# Patient Record
Sex: Female | Born: 1980 | Race: Black or African American | Hispanic: No | Marital: Married | Smoking: Never smoker
Health system: Southern US, Community
[De-identification: ages and names within clinical notes are randomized; demographics above are authoritative.]

## PROBLEM LIST (undated history)

## (undated) ENCOUNTER — Inpatient Hospital Stay (HOSPITAL_COMMUNITY): Payer: Self-pay

## (undated) DIAGNOSIS — F419 Anxiety disorder, unspecified: Secondary | ICD-10-CM

## (undated) DIAGNOSIS — I10 Essential (primary) hypertension: Secondary | ICD-10-CM

## (undated) DIAGNOSIS — N390 Urinary tract infection, site not specified: Secondary | ICD-10-CM

## (undated) DIAGNOSIS — R51 Headache: Secondary | ICD-10-CM

---
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
## 2001-04-28 ENCOUNTER — Emergency Department (HOSPITAL_COMMUNITY): Admission: EM | Admit: 2001-04-28 | Discharge: 2001-04-28 | Payer: Self-pay | Admitting: Emergency Medicine

## 2003-03-03 ENCOUNTER — Emergency Department (HOSPITAL_COMMUNITY): Admission: AD | Admit: 2003-03-03 | Discharge: 2003-03-03 | Payer: Self-pay | Admitting: Internal Medicine

## 2003-03-05 ENCOUNTER — Ambulatory Visit (HOSPITAL_COMMUNITY): Admission: RE | Admit: 2003-03-05 | Discharge: 2003-03-05 | Payer: Self-pay | Admitting: Family Medicine

## 2003-03-05 ENCOUNTER — Emergency Department (HOSPITAL_COMMUNITY): Admission: EM | Admit: 2003-03-05 | Discharge: 2003-03-05 | Payer: Self-pay | Admitting: Family Medicine

## 2003-03-06 ENCOUNTER — Emergency Department (HOSPITAL_COMMUNITY): Admission: EM | Admit: 2003-03-06 | Discharge: 2003-03-06 | Payer: Self-pay | Admitting: Family Medicine

## 2003-04-21 ENCOUNTER — Inpatient Hospital Stay (HOSPITAL_COMMUNITY): Admission: AD | Admit: 2003-04-21 | Discharge: 2003-04-21 | Payer: Self-pay | Admitting: Obstetrics and Gynecology

## 2003-04-24 ENCOUNTER — Emergency Department (HOSPITAL_COMMUNITY): Admission: EM | Admit: 2003-04-24 | Discharge: 2003-04-24 | Payer: Self-pay | Admitting: Emergency Medicine

## 2005-10-27 ENCOUNTER — Emergency Department (HOSPITAL_COMMUNITY): Admission: EM | Admit: 2005-10-27 | Discharge: 2005-10-28 | Payer: Self-pay | Admitting: Emergency Medicine

## 2005-11-09 ENCOUNTER — Emergency Department (HOSPITAL_COMMUNITY): Admission: EM | Admit: 2005-11-09 | Discharge: 2005-11-09 | Payer: Self-pay | Admitting: Emergency Medicine

## 2005-11-23 ENCOUNTER — Emergency Department (HOSPITAL_COMMUNITY): Admission: EM | Admit: 2005-11-23 | Discharge: 2005-11-23 | Payer: Self-pay | Admitting: Emergency Medicine

## 2006-05-14 ENCOUNTER — Emergency Department (HOSPITAL_COMMUNITY): Admission: EM | Admit: 2006-05-14 | Discharge: 2006-05-14 | Payer: Self-pay | Admitting: Emergency Medicine

## 2006-05-16 ENCOUNTER — Emergency Department (HOSPITAL_COMMUNITY): Admission: EM | Admit: 2006-05-16 | Discharge: 2006-05-16 | Payer: Self-pay | Admitting: Family Medicine

## 2006-06-07 ENCOUNTER — Emergency Department (HOSPITAL_COMMUNITY): Admission: EM | Admit: 2006-06-07 | Discharge: 2006-06-07 | Payer: Self-pay | Admitting: Emergency Medicine

## 2007-06-10 ENCOUNTER — Inpatient Hospital Stay (HOSPITAL_COMMUNITY): Admission: AD | Admit: 2007-06-10 | Discharge: 2007-06-10 | Payer: Self-pay | Admitting: Obstetrics and Gynecology

## 2007-06-19 ENCOUNTER — Inpatient Hospital Stay (HOSPITAL_COMMUNITY): Admission: RE | Admit: 2007-06-19 | Discharge: 2007-06-19 | Payer: Self-pay | Admitting: Obstetrics and Gynecology

## 2007-07-13 ENCOUNTER — Inpatient Hospital Stay (HOSPITAL_COMMUNITY): Admission: AD | Admit: 2007-07-13 | Discharge: 2007-07-13 | Payer: Self-pay | Admitting: Obstetrics and Gynecology

## 2007-09-07 ENCOUNTER — Inpatient Hospital Stay (HOSPITAL_COMMUNITY): Admission: AD | Admit: 2007-09-07 | Discharge: 2007-09-07 | Payer: Self-pay | Admitting: Obstetrics and Gynecology

## 2007-10-01 ENCOUNTER — Inpatient Hospital Stay (HOSPITAL_COMMUNITY): Admission: AD | Admit: 2007-10-01 | Discharge: 2007-10-01 | Payer: Self-pay | Admitting: Obstetrics and Gynecology

## 2007-11-28 ENCOUNTER — Encounter: Admission: RE | Admit: 2007-11-28 | Discharge: 2007-11-28 | Payer: Self-pay | Admitting: Obstetrics and Gynecology

## 2008-01-07 ENCOUNTER — Inpatient Hospital Stay (HOSPITAL_COMMUNITY): Admission: AD | Admit: 2008-01-07 | Discharge: 2008-01-07 | Payer: Self-pay | Admitting: Obstetrics and Gynecology

## 2008-02-03 ENCOUNTER — Inpatient Hospital Stay (HOSPITAL_COMMUNITY): Admission: AD | Admit: 2008-02-03 | Discharge: 2008-02-04 | Payer: Self-pay | Admitting: Obstetrics and Gynecology

## 2008-02-04 ENCOUNTER — Inpatient Hospital Stay (HOSPITAL_COMMUNITY): Admission: AD | Admit: 2008-02-04 | Discharge: 2008-02-07 | Payer: Self-pay | Admitting: Obstetrics and Gynecology

## 2008-04-19 ENCOUNTER — Emergency Department (HOSPITAL_COMMUNITY): Admission: EM | Admit: 2008-04-19 | Discharge: 2008-04-19 | Payer: Self-pay | Admitting: Emergency Medicine

## 2008-06-06 ENCOUNTER — Emergency Department (HOSPITAL_COMMUNITY): Admission: EM | Admit: 2008-06-06 | Discharge: 2008-06-06 | Payer: Self-pay | Admitting: Emergency Medicine

## 2008-09-08 ENCOUNTER — Emergency Department (HOSPITAL_COMMUNITY): Admission: EM | Admit: 2008-09-08 | Discharge: 2008-09-08 | Payer: Self-pay | Admitting: Family Medicine

## 2010-01-04 ENCOUNTER — Emergency Department (HOSPITAL_COMMUNITY)
Admission: EM | Admit: 2010-01-04 | Discharge: 2010-01-04 | Payer: Self-pay | Source: Home / Self Care | Admitting: Emergency Medicine

## 2010-02-21 ENCOUNTER — Encounter: Payer: Self-pay | Admitting: Family Medicine

## 2010-04-13 LAB — POCT RAPID STREP A (OFFICE): Streptococcus, Group A Screen (Direct): NEGATIVE

## 2010-05-12 LAB — URINE CULTURE: Colony Count: >=100000

## 2010-05-12 LAB — URINALYSIS, ROUTINE W REFLEX MICROSCOPIC
Ketones, ur: NEGATIVE mg/dL
Nitrite: NEGATIVE
Protein, ur: 100 mg/dL — AB

## 2010-05-12 LAB — PREGNANCY, URINE: Preg Test, Ur: NEGATIVE

## 2010-05-12 LAB — URINE MICROSCOPIC-ADD ON

## 2010-05-18 LAB — CBC
HCT: 35.3 % — ABNORMAL LOW (ref 36.0–46.0)
HCT: 40.5 % (ref 36.0–46.0)
HCT: 41.8 % (ref 36.0–46.0)
Hemoglobin: 13.6 g/dL (ref 12.0–15.0)
MCHC: 33.4 g/dL (ref 30.0–36.0)
MCV: 93.8 fL (ref 78.0–100.0)
Platelets: 199 10*3/uL (ref 150–400)
Platelets: 209 10*3/uL (ref 150–400)
Platelets: 234 10*3/uL (ref 150–400)
RBC: 4.32 MIL/uL (ref 3.87–5.11)
RDW: 15.2 % (ref 11.5–15.5)
RDW: 15.4 % (ref 11.5–15.5)
WBC: 14.4 10*3/uL — ABNORMAL HIGH (ref 4.0–10.5)
WBC: 15.1 10*3/uL — ABNORMAL HIGH (ref 4.0–10.5)

## 2010-05-18 LAB — CCBB MATERNAL DONOR DRAW

## 2010-05-18 LAB — URINALYSIS, DIPSTICK ONLY
Nitrite: NEGATIVE
Protein, ur: NEGATIVE mg/dL
Specific Gravity, Urine: <1.005 — ABNORMAL LOW (ref 1.005–1.030)
Urobilinogen, UA: 0.2 mg/dL (ref 0.0–1.0)

## 2010-05-18 LAB — GLUCOSE, CAPILLARY

## 2010-05-18 LAB — COMPREHENSIVE METABOLIC PANEL
Alkaline Phosphatase: 62 U/L (ref 39–117)
BUN: 5 mg/dL — ABNORMAL LOW (ref 6–23)
CO2: 24 mEq/L (ref 19–32)
Chloride: 98 mEq/L (ref 96–112)
Creatinine, Ser: 0.73 mg/dL (ref 0.4–1.2)
GFR calc non Af Amer: >60 mL/min (ref >60)
Glucose, Bld: 112 mg/dL — ABNORMAL HIGH (ref 70–99)
Potassium: 4.6 mEq/L (ref 3.5–5.1)
Total Bilirubin: 0.6 mg/dL (ref 0.3–1.2)

## 2010-05-18 LAB — URIC ACID: Uric Acid, Serum: 5.8 mg/dL (ref 2.4–7.0)

## 2010-05-18 LAB — LACTATE DEHYDROGENASE: LDH: 131 U/L (ref 94–250)

## 2010-06-16 NOTE — Op Note (Signed)
NAMEDoreen Hays, [name]               ACCOUNT NO.:  [id number]   MEDICAL RECORD NO.:  [id number]          PATIENT TYPE:  INP   LOCATION:  9146                          FACILITY:  [facility]   PHYSICIAN:  Huel Cote, M.D. DATE OF BIRTH:  1980-07-14   DATE OF PROCEDURE:  02/04/2008  DATE OF DISCHARGE:                               OPERATIVE REPORT   PREOPERATIVE DIAGNOSES:  1. Term pregnancy at 39+ weeks.  2. Gestational diabetes mellitus.  3. Arrest of dilatation and descent relative.   POSTOPERATIVE DIAGNOSES:  1. Term pregnancy at 39+ weeks.  2. Gestational diabetes mellitus.  3. Arrest of dilatation and descent relative.   PROCEDURE:  Primary low transverse cesarean section with double layer  closure of uterus.   SURGEON:  Huel Cote, MD   ANESTHESIA:  Epidural.   FINDINGS:  A vigorous female infant in the vertex presentation.  Apgars  were 9 and 9.  Weight was 7 pounds 4 ounces.  She had normal uterus,  tubes, and ovaries noted bilaterally.   SPECIMEN:  Placenta which was sent to L&D.   ESTIMATED BLOOD LOSS:  650 mL.   URINE OUTPUT:  100 mL urine, which cleared as the case progressed.   INTRAVENOUS FLUIDS:  1900 mL LR.   COMPLICATIONS:  None known.   PROCEDURE:  After appropriate informed consent was obtained.  When the  patient declined to continue to labor and refused to push, she was taken  to the operating room and epidural anesthesia found to be adequate by  Allis clamp test.  She was then prepped and draped in the normal sterile  fashion in the dorsal supine position with a leftward tilt.  A  Pfannenstiel skin incision was then made with a scalpel and carried  through to the underlying layer of fascia by sharp dissection and Bovie  cautery.  Fascia was nicked in the midline and the incision extended  laterally with Mayo scissors.  The inferior aspect of the incision was  grasped with Kocher clamps, elevated, and dissected off the underlying  rectus  muscles.  In the similar fashion, these were dissected off the  rectus muscles superiorly.  The rectus muscles were separated in the  midline and the peritoneal cavity entered bluntly.  The peritoneal  incision was then extended both superiorly and inferiorly with careful  attention to avoid both bowel and bladder.  The Alexis self-retaining  wound retractor was then placed within the incision and the lower  uterine segment exposed well.  The bladder flap was then created and  pushed away from the lower uterine segment, which was then incised in a  transverse fashion.  The cavity itself was entered bluntly.  The  infant's head was then delivered atraumatically and nose and mouth bulb  suctioned.  The remainder of the infant delivered easily.  Cord clamped  and cut and handed to awaiting pediatricians.  The placenta was  expressed spontaneously and handed off for cord blood donation.  The  uterus was cleared of all clots and debris with moist lap sponge and the  uterine incision was then closed  in 2 layers, the first a running locked  layer of 0 chromic and the second an imbricating layer of the same  suture.  Once this was closed, a good hemostasis was noted and the  ovaries and tubes were inspected bilaterally.  The abdomen and pelvis  were irrigated and the gutters cleared of all clots and debris with  moist lap sponge.  The incision was once again inspected and no active  bleeding noted.  Several small areas of oozing were controlled with  Bovie cautery, and at this point, all instruments and sponges were  removed from the abdomen.  The Alexis retractor was removed and the  rectus muscles reapproximated with several interrupted mattress sutures  of 0 Vicryl.  The fascia was then closed with 0 Vicryl in a running  fashion and the subcutaneous tissue reapproximated with 3-0 plain in a  running fashion and the skin closed with staples.  Sponge, lap, and  needle counts were correct x2, and  the patient was taken in good  condition to the recovery room.      Huel Cote, M.D.  Electronically Signed     KR/MEDQ  D:  02/04/2008  T:  02/05/2008  Job:  [id number]

## 2010-06-19 NOTE — Discharge Summary (Signed)
NAMEDoreen Hays, Caylie               ACCOUNT NO.:  [id number]   MEDICAL RECORD NO.:  [id number]          PATIENT TYPE:  INP   LOCATION:  9146                          FACILITY:  [facility]   PHYSICIAN:  Huel Cote, M.D. DATE OF BIRTH:  06-15-1980   DATE OF ADMISSION:  02/04/2008  DATE OF DISCHARGE:  02/07/2008                               DISCHARGE SUMMARY   DISCHARGE DIAGNOSES:  1. Term pregnancy at 39 weeks delivered.  2. Arrest of dilation and descent.  3. Status post primary low transverse cesarean section with double-      layer closure of uterus.   DISCHARGE MEDICATIONS:  1. Motrin 600 every 6 hours p.r.n.  2. Percocet 1-2 tablets p.o. every 4 hours p.r.n.   DISCHARGE FOLLOWUP:  The patient is to follow up in the office in 2  weeks for an incision check.   HOSPITAL COURSE:  The patient is a 30 year old G1, P0 who was admitted  at 34 plus weeks' gestation with contractions every 5 minutes.  Cervix  changed from 80 and 2 cm to 80 and 3 cm.   OBSERVATION:  Prenatal care was complicated by slightly shortened cervix  on ultrasound with a positive fetal fibronectin at 25 weeks.  Also, she  had gestational diabetes which was diet controlled overall adequately  with occasional high postprandial blood sugars.  Size was greater than  dates on her last ultrasound at 35 weeks.  The patient did have an  average-size baby documented, however.   PRENATAL LABS:  Are as follows; O positive, antibody negative, sickle  negative, RPR nonreactive, rubella immune, hepatitis B surface antigen  negative, HIV negative, GC negative, Chlamydia negative, group B strep  negative, 1-hour Glucola 144, and 3-hour Glucola was not tolerated, so  the patient did finger sticks.  First trimester screen normal.   PAST OBSTETRICAL HISTORY:  None.   PAST GYN HISTORY:  No abnormal Pap smears.   PAST MEDICAL HISTORY:  None.   PAST SURGICAL HISTORY:  Breast reduction in 2000.   ALLERGIES:  None.   On  admission, the patient was afebrile with stable vital signs.  Fetal  heart rate was reactive.  Blood pressure was 148/80s.  She, as stated,  progressed to 3-4 cm, was admitted and received an epidural.  After the  epidural, the patient did have a fetal heart rate deceleration to the  90s that responded to position change and ephedrine.  She had internal  monitors placed to assist with adjusting any Pitocin augmentation.  She  also had another impressive deceleration to the 50s to 60s, which took  approximately 67 minutes to fully recover.  She did have repositioning,  oxygen applied and scalp stimulation, and was given more ephedrine at  this point and the baby did recover, though slowly.  At this point, she  was complete 4-5 cm totally effaced and 4-5 cm in a -1 station.  We  watched the fetal heart rate closely, which did improve back to  reassuring and she was placed on Pitocin augmentation.  She progressed  to 8-9 cm with OP presentation and  began having some rectal pressure  which was bothersome to her.  We continued to follow her progress and  after approximately another 2 hours, she was complete and rim at a 0  station with an LOP presentation.  I discussed with the patient that the  rim was reducible and that we could try to get her more comfortable and  continue to passively labor to see if the head would get any lower or  she could begin pushing.  The patient declined both options and stated  that she felt too weak to push ever and therefore we placed her in  exaggerated films and let her continue to labor for an additional hour.  At this point, the patient remained essentially the same.  Cervix was  complete with a reducible rim OP in a 0 station.  We again offered the  option of reducing the epidural and trying to continue pushing.  The  patient flatly declined pushing longer stating she was too weak and too  tired and wished to proceed with a C-section without any further   attempts at vaginal delivery.  We discussed the risks and benefits of  this in detail and she stated she wished to proceed.  She, therefore,  underwent a primary low transverse C-section with a double-layer closure  of uterus.  She was delivered of a vigorous female infant, Apgars were 9  and 9, weight was 7 pounds 4 ounces.  She was admitted for routine  postoperative care and did well.  On postop day #1, her hemoglobin was  11.9, which was appropriate drop from her cesarean section.  By postop  day #3, she was doing quite well, tolerating regular diet, her incision  was clear, her Staples removed and Steri-Strips placed, and she was  discharged to home with followup as stated in the office in 2 weeks for  an incision check.      Huel Cote, M.D.  Electronically Signed     KR/MEDQ  D:  03/02/2008  T:  03/02/2008  Job:  [id number]

## 2010-11-28 ENCOUNTER — Inpatient Hospital Stay (HOSPITAL_COMMUNITY): Payer: Medicaid Other

## 2010-11-28 ENCOUNTER — Inpatient Hospital Stay (HOSPITAL_COMMUNITY)
Admission: AD | Admit: 2010-11-28 | Discharge: 2010-11-28 | Disposition: A | Discharge status: Home / Self Care | Payer: Medicaid Other | Source: Ambulatory Visit | Attending: Obstetrics & Gynecology | Admitting: Obstetrics & Gynecology

## 2010-11-28 ENCOUNTER — Encounter (HOSPITAL_COMMUNITY): Payer: Self-pay | Admitting: *Deleted

## 2010-11-28 DIAGNOSIS — O99891 Other specified diseases and conditions complicating pregnancy: Secondary | ICD-10-CM | POA: Insufficient documentation

## 2010-11-28 DIAGNOSIS — K5289 Other specified noninfective gastroenteritis and colitis: Secondary | ICD-10-CM

## 2010-11-28 DIAGNOSIS — Z369 Encounter for antenatal screening, unspecified: Secondary | ICD-10-CM

## 2010-11-28 DIAGNOSIS — R109 Unspecified abdominal pain: Secondary | ICD-10-CM | POA: Insufficient documentation

## 2010-11-28 DIAGNOSIS — K529 Noninfective gastroenteritis and colitis, unspecified: Secondary | ICD-10-CM

## 2010-11-28 DIAGNOSIS — Z36 Encounter for antenatal screening of mother: Secondary | ICD-10-CM

## 2010-11-28 HISTORY — DX: Urinary tract infection, site not specified: N39.0

## 2010-11-28 LAB — URINE MICROSCOPIC-ADD ON

## 2010-11-28 LAB — POCT PREGNANCY, URINE: Preg Test, Ur: POSITIVE

## 2010-11-28 LAB — URINALYSIS, ROUTINE W REFLEX MICROSCOPIC
Glucose, UA: NEGATIVE mg/dL
Specific Gravity, Urine: 1.02 (ref 1.005–1.030)
pH: 7 (ref 5.0–8.0)

## 2010-11-28 LAB — CBC
HCT: 39.5 % (ref 36.0–46.0)
MCH: 29.7 pg (ref 26.0–34.0)
MCV: 87.6 fL (ref 78.0–100.0)
Platelets: 279 10*3/uL (ref 150–400)
RBC: 4.51 MIL/uL (ref 3.87–5.11)
RDW: 14.3 % (ref 11.5–15.5)
WBC: 12.5 10*3/uL — ABNORMAL HIGH (ref 4.0–10.5)

## 2010-11-28 LAB — DIFFERENTIAL
Eosinophils Absolute: 0.1 10*3/uL (ref 0.0–0.7)
Eosinophils Relative: 1 % (ref 0–5)
Lymphocytes Relative: 29 % (ref 12–46)
Lymphs Abs: 3.6 10*3/uL (ref 0.7–4.0)
Monocytes Absolute: 1.2 10*3/uL — ABNORMAL HIGH (ref 0.1–1.0)

## 2010-11-28 LAB — WET PREP, GENITAL
Trich, Wet Prep: NONE SEEN
Yeast Wet Prep HPF POC: NONE SEEN

## 2010-11-28 MED ORDER — PROMETHAZINE HCL 25 MG PO TABS: 12.5000 mg | ORAL_TABLET | Freq: Four times a day (QID) | ORAL | Status: AC | PRN

## 2010-11-28 NOTE — Progress Notes (Signed)
HPT +, Pt reports having LLQ pain on and off since yesterday. Pain increased after her daughter ran into her about an hour ago. Denies and vag bleeding or discharge.

## 2010-11-28 NOTE — ED Provider Notes (Signed)
History     CSN: [id number] Arrival date & time: 11/28/2010  5:49 PM   None     Chief Complaint  Patient presents with  . Abdominal Pain   HPI Jamie Hays is a 29 y.o. obese AA female who presents to [facility] for lower abdominal pain and a positive home pregnancy test. She states the pain started a few days ago and is associated with nausea, vomiting and diarrhea. The history was provided by the patient. Past Medical History  Diagnosis Date  . Urinary tract infection     Past Surgical History  Procedure Date  . Cesarean section   . Breast reduction surgery 2000    No family history on file.  History  Substance Use Topics  . Smoking status: Never Smoker   . Smokeless tobacco: Never Used  . Alcohol Use: 0.6 oz/week    1 Shots of liquor per week    OB History    Grav Para Term Preterm Abortions TAB SAB Ect Mult Living   1               Review of Systems  Constitutional: Positive for chills and fatigue. Negative for appetite change.  HENT: Negative.   Respiratory: Negative.   Gastrointestinal: Positive for nausea, vomiting, abdominal pain and diarrhea.  Genitourinary: Positive for frequency, vaginal discharge and pelvic pain. Negative for dysuria, vaginal bleeding and difficulty urinating.  Skin: Negative.   Neurological: Negative for dizziness.  Psychiatric/Behavioral: Negative for agitation. The patient is not nervous/anxious.     Allergies  Review of patient's allergies indicates no known allergies.  Home Medications  No current outpatient prescriptions on file.  BP 130/97  Pulse 92  Temp(Src) 99.9 F (37.7 C) (Oral)  Resp 18  Ht 5\' 2"  (1.575 m)  Wt 225 lb (102.059 kg)  BMI 41.15 kg/m2  LMP 10/01/2010  Physical Exam  Nursing note and vitals reviewed. Constitutional: She is oriented to person, place, and time. No distress.       Obese   HENT:  Head: Normocephalic.  Eyes: EOM are normal.  Neck: Neck supple.  Cardiovascular: Normal rate.     Pulmonary/Chest: Effort normal.  Abdominal: Soft. There is no tenderness.  Genitourinary:       External genitalia without lesions. White vaginal discharge. Cervix long and closed, no CMT, mild left adnexal tenderness. Uterus without palpable enlargement.  Musculoskeletal: Normal range of motion.  Neurological: She is alert and oriented to person, place, and time. No cranial nerve deficit.  Skin: Skin is warm and dry.  Psychiatric: She has a normal mood and affect. Her behavior is normal. Judgment and thought content normal.   Results for orders placed during the hospital encounter of 11/28/10 (from the past 24 hour(s))  URINALYSIS, ROUTINE W REFLEX MICROSCOPIC     Status: Abnormal   Collection Time   11/28/10  6:00 PM      Component Value Range   Color, Urine YELLOW  YELLOW    Appearance CLEAR  CLEAR    Specific Gravity, Urine 1.020  1.005 - 1.030    pH 7.0  5.0 - 8.0    Glucose, UA NEGATIVE  NEGATIVE (mg/dL)   Hgb urine dipstick TRACE (*) NEGATIVE    Bilirubin Urine NEGATIVE  NEGATIVE    Ketones, ur NEGATIVE  NEGATIVE (mg/dL)   Protein, ur 30 (*) NEGATIVE (mg/dL)   Urobilinogen, UA 0.2  0.0 - 1.0 (mg/dL)   Nitrite NEGATIVE  NEGATIVE    Leukocytes, UA  NEGATIVE  NEGATIVE   URINE MICROSCOPIC-ADD ON     Status: Abnormal   Collection Time   11/28/10  6:00 PM      Component Value Range   Squamous Epithelial / LPF FEW (*) RARE    WBC, UA 0-2  <3 (WBC/hpf)   RBC / HPF 0-2  <3 (RBC/hpf)   Bacteria, UA MANY (*) RARE   POCT PREGNANCY, URINE     Status: Normal   Collection Time   11/28/10  6:07 PM      Component Value Range   Preg Test, Ur POSITIVE    WET PREP, GENITAL     Status: Abnormal   Collection Time   11/28/10  6:52 PM      Component Value Range   Yeast, Wet Prep NONE SEEN  NONE SEEN    Trich, Wet Prep NONE SEEN  NONE SEEN    Clue Cells, Wet Prep FEW (*) NONE SEEN    WBC, Wet Prep HPF POC FEW (*) NONE SEEN   ABO/RH     Status: Normal   Collection Time   11/28/10  6:52  PM      Component Value Range   ABO/RH(D) O POS    CBC     Status: Abnormal   Collection Time   11/28/10  6:53 PM      Component Value Range   WBC 12.5 (*) 4.0 - 10.5 (K/uL)   RBC 4.51  3.87 - 5.11 (MIL/uL)   Hemoglobin 13.4  12.0 - 15.0 (g/dL)   HCT 45.4  09.8 - 11.9 (%)   MCV 87.6  78.0 - 100.0 (fL)   MCH 29.7  26.0 - 34.0 (pg)   MCHC 33.9  30.0 - 36.0 (g/dL)   RDW 14.7  82.9 - 56.2 (%)   Platelets 279  150 - 400 (K/uL)  DIFFERENTIAL     Status: Abnormal   Collection Time   11/28/10  6:53 PM      Component Value Range   Neutrophils Relative 61  43 - 77 (%)   Neutro Abs 7.6  1.7 - 7.7 (K/uL)   Lymphocytes Relative 29  12 - 46 (%)   Lymphs Abs 3.6  0.7 - 4.0 (K/uL)   Monocytes Relative 10  3 - 12 (%)   Monocytes Absolute 1.2 (*) 0.1 - 1.0 (K/uL)   Eosinophils Relative 1  0 - 5 (%)   Eosinophils Absolute 0.1  0.0 - 0.7 (K/uL)   Basophils Relative 0  0 - 1 (%)   Basophils Absolute 0.0  0.0 - 0.1 (K/uL)  HCG, QUANTITATIVE, PREGNANCY     Status: Abnormal   Collection Time   11/28/10  6:53 PM      Component Value Range   hCG, Beta Chain, Quant, S 24322 (*) <5 (mIU/mL)   US Ob Comp Less 14 Wks  11/28/2010  *RADIOLOGY REPORT*  Clinical Data: 30 year old female with left lower quadrant pain.  8 weeks 2 days pregnant by LMP.  OBSTETRIC <14 WK ULTRASOUND  Technique:  Transabdominal ultrasound was performed for evaluation of the gestation as well as the maternal uterus and adnexal regions.  Comparison:  None relative.  Intrauterine gestational sac: Single Yolk sac: Visible Embryo: Visible Cardiac Activity: Detected Heart Rate: 113 bpm  CRL:  6.4 mm  6w  4d          [place] EDC: 07/20/2011  Maternal uterus/Adnexae: No subchorionic hemorrhage.  No pelvic free fluid.  The right ovary is normal measuring  2.4 x 1.3 x 1.5 cm.  The left ovary is normal measuring 3.3 x 1.8 x 2.1 cm.  IMPRESSION: Viable singleton intrauterine pregnancy with estimated gestational age of [redacted] weeks and 4 days by crown-rump  length.  Original Report Authenticated By: Harley Hallmark, M.D.   Assessment: First trimester pregnancy/viable IUP   Gastroenteritis  Plan:  B.R.A.T diet   Phenergan    Start prenatal care   Return here as needed.  ED Course  Procedures  MDM          Kerrie Buffalo, NP 11/28/10 [phone]

## 2010-12-01 LAB — GC/CHLAMYDIA PROBE AMP, GENITAL: GC Probe Amp, Genital: NEGATIVE

## 2010-12-16 ENCOUNTER — Encounter (HOSPITAL_COMMUNITY): Payer: Self-pay | Admitting: *Deleted

## 2010-12-16 ENCOUNTER — Inpatient Hospital Stay (HOSPITAL_COMMUNITY)
Admission: AD | Admit: 2010-12-16 | Discharge: 2010-12-16 | Disposition: A | Discharge status: Home / Self Care | Payer: Medicaid Other | Source: Ambulatory Visit | Attending: Obstetrics and Gynecology | Admitting: Obstetrics and Gynecology

## 2010-12-16 DIAGNOSIS — B9689 Other specified bacterial agents as the cause of diseases classified elsewhere: Secondary | ICD-10-CM | POA: Insufficient documentation

## 2010-12-16 DIAGNOSIS — A499 Bacterial infection, unspecified: Secondary | ICD-10-CM

## 2010-12-16 DIAGNOSIS — O2390 Unspecified genitourinary tract infection in pregnancy, unspecified trimester: Secondary | ICD-10-CM | POA: Insufficient documentation

## 2010-12-16 DIAGNOSIS — R109 Unspecified abdominal pain: Secondary | ICD-10-CM | POA: Insufficient documentation

## 2010-12-16 DIAGNOSIS — N760 Acute vaginitis: Secondary | ICD-10-CM | POA: Insufficient documentation

## 2010-12-16 LAB — URINALYSIS, ROUTINE W REFLEX MICROSCOPIC
Bilirubin Urine: NEGATIVE
Ketones, ur: NEGATIVE mg/dL
Nitrite: NEGATIVE
Protein, ur: NEGATIVE mg/dL
Urobilinogen, UA: 0.2 mg/dL (ref 0.0–1.0)
pH: 6.5 (ref 5.0–8.0)

## 2010-12-16 LAB — URINE MICROSCOPIC-ADD ON

## 2010-12-16 LAB — WET PREP, GENITAL: Trich, Wet Prep: NONE SEEN

## 2010-12-16 MED ORDER — CONCEPT OB 130-92.4-1 MG PO CAPS: 1.0000 | ORAL_CAPSULE | Freq: Every day | ORAL | Status: DC

## 2010-12-16 MED ORDER — METRONIDAZOLE 500 MG PO TABS: 500.0000 mg | ORAL_TABLET | Freq: Two times a day (BID) | ORAL | Status: AC

## 2010-12-16 NOTE — ED Notes (Signed)
Levi Aland CNM at bedside with ultrasound to verify fetal heart rate. Heart rate seen on ultrasound.

## 2010-12-16 NOTE — Progress Notes (Signed)
Pt reports she has been having cramps since she found out she was pregnant but worsened last pm. Denies vaginal bleeding or discharge. Denies dysuria.

## 2010-12-16 NOTE — ED Provider Notes (Signed)
History   Jamie Hays is a 30 y.o. year old G29P1001 female at [redacted]w[redacted]d weeks gestation who presents to [facility] reporting cramping throughout the pregnancy that worsened last night. She denies vaginal bleeding, vaginal discharge or UTI Sx.  CSN: [id number] Arrival date & time: 12/16/2010  9:05 PM   None     Chief Complaint  Patient presents with  . Abdominal Pain    (Consider location/radiation/quality/duration/timing/severity/associated sxs/prior treatment) HPI  Past Medical History  Diagnosis Date  . Urinary tract infection     Past Surgical History  Procedure Date  . Cesarean section   . Breast reduction surgery 2000    History reviewed. No pertinent family history.  History  Substance Use Topics  . Smoking status: Never Smoker   . Smokeless tobacco: Never Used  . Alcohol Use: 0.6 oz/week    1 Shots of liquor per week    OB History    Grav Para Term Preterm Abortions TAB SAB Ect Mult Living   2 1 1       1       Review of Systems; Otherwise neg  Allergies  Review of patient's allergies indicates no known allergies.  Home Medications  No current outpatient prescriptions on file.  BP 139/91  Pulse 92  Temp 98 F (36.7 C)  Resp 18  Ht 5\' 2"  (1.575 m)  Wt 106.142 kg (234 lb)  BMI 42.80 kg/m2  SpO2 97%  LMP 10/01/2010  Physical Exam  Nursing note and vitals reviewed. Constitutional: She is oriented to person, place, and time. She appears well-developed and well-nourished. No distress.  Cardiovascular: Normal rate.   Pulmonary/Chest: Effort normal.  Abdominal: Soft. There is no tenderness.  Genitourinary: Vagina normal. No bleeding around the vagina. No vaginal discharge found.  Neurological: She is alert and oriented to person, place, and time.  Skin: Skin is warm and dry.  Psychiatric: She has a normal mood and affect.     Cervix long and closed FHR 160's by informal BS [place]. FM seen.  Results for orders placed during the hospital encounter of  12/16/10 (from the past 24 hour(s))  URINALYSIS, ROUTINE W REFLEX MICROSCOPIC     Status: Abnormal   Collection Time   12/16/10  9:45 PM      Component Value Range   Color, Urine YELLOW  YELLOW    Appearance CLEAR  CLEAR    Specific Gravity, Urine >1.030 (*) 1.005 - 1.030    pH 6.5  5.0 - 8.0    Glucose, UA NEGATIVE  NEGATIVE (mg/dL)   Hgb urine dipstick SMALL (*) NEGATIVE    Bilirubin Urine NEGATIVE  NEGATIVE    Ketones, ur NEGATIVE  NEGATIVE (mg/dL)   Protein, ur NEGATIVE  NEGATIVE (mg/dL)   Urobilinogen, UA 0.2  0.0 - 1.0 (mg/dL)   Nitrite NEGATIVE  NEGATIVE    Leukocytes, UA NEGATIVE  NEGATIVE   URINE MICROSCOPIC-ADD ON     Status: Normal   Collection Time   12/16/10  9:45 PM      Component Value Range   Squamous Epithelial / LPF RARE  RARE    RBC / HPF 0-2  <3 (RBC/hpf)   Bacteria, UA RARE  RARE    Urine-Other MUCOUS PRESENT    WET PREP, GENITAL     Status: Abnormal   Collection Time   12/16/10 10:01 PM      Component Value Range   Yeast, Wet Prep NONE SEEN  NONE SEEN    Trich, Wet  Prep NONE SEEN  NONE SEEN    Clue Cells, Wet Prep FEW (*) NONE SEEN    WBC, Wet Prep HPF POC RARE (*) NONE SEEN     ED Course  Procedures (including critical care time)   MDM  Assessment: 1. 9.1 weeks IUP 2. BV  Plan: 1. Rx Flagyl and PNV 2. Start PNC 3. Urine Culture  Dorathy Kinsman 12/16/2010 10:53 PM

## 2010-12-18 LAB — URINE CULTURE: Special Requests: NORMAL

## 2010-12-19 NOTE — ED Provider Notes (Signed)
Attestation of Attending Supervision of Advanced Practitioner: Evaluation and management procedures were performed by the PA/NP/CNM/OB Fellow under my supervision/collaboration. Chart reviewed and agree with management and plan.  Kismet Facemire V 12/19/2010 7:52 AM

## 2011-01-12 ENCOUNTER — Encounter (HOSPITAL_COMMUNITY): Payer: Self-pay | Admitting: *Deleted

## 2011-01-12 ENCOUNTER — Inpatient Hospital Stay (HOSPITAL_COMMUNITY)
Admission: AD | Admit: 2011-01-12 | Discharge: 2011-01-12 | Disposition: A | Discharge status: Home / Self Care | Payer: Medicaid Other | Source: Ambulatory Visit | Attending: Obstetrics & Gynecology | Admitting: Obstetrics & Gynecology

## 2011-01-12 DIAGNOSIS — O210 Mild hyperemesis gravidarum: Secondary | ICD-10-CM | POA: Insufficient documentation

## 2011-01-12 DIAGNOSIS — O219 Vomiting of pregnancy, unspecified: Secondary | ICD-10-CM

## 2011-01-12 HISTORY — DX: Essential (primary) hypertension: I10

## 2011-01-12 HISTORY — DX: Headache: R51

## 2011-01-12 LAB — URINALYSIS, ROUTINE W REFLEX MICROSCOPIC
Bilirubin Urine: NEGATIVE
Leukocytes, UA: NEGATIVE
Nitrite: NEGATIVE
Specific Gravity, Urine: >1.03 — ABNORMAL HIGH (ref 1.005–1.030)
pH: 6 (ref 5.0–8.0)

## 2011-01-12 LAB — URINE MICROSCOPIC-ADD ON

## 2011-01-12 MED ORDER — PROMETHAZINE HCL 25 MG/ML IJ SOLN: 25.0000 mg | Freq: Four times a day (QID) | INTRAMUSCULAR | Status: DC | PRN

## 2011-01-12 MED ORDER — ONDANSETRON 4 MG PO TBDP: 4.0000 mg | ORAL_TABLET | Freq: Three times a day (TID) | ORAL | Status: AC | PRN

## 2011-01-12 MED ORDER — ONDANSETRON 8 MG PO TBDP
8.0000 mg | ORAL_TABLET | Freq: Once | ORAL | Status: AC
  Administered 2011-01-12: 8 mg via ORAL
  Filled 2011-01-12: qty 1

## 2011-01-12 NOTE — ED Provider Notes (Signed)
History     Chief Complaint  Patient presents with  . Abdominal Pain   HPI 30 y.o. G2P1001 at G2P1001 with nausea and vomiting x 1 week. Able to eat and keep some things downs. Planning care with [facility], awaiting Medicaid.    Past Medical History  Diagnosis Date  . Urinary tract infection   . Hypertension   . Headache     Past Surgical History  Procedure Date  . Cesarean section   . Breast reduction surgery 2000    Family History  Problem Relation Age of Onset  . Anesthesia problems Neg Hx   . Hypotension Neg Hx   . Malignant hyperthermia Neg Hx   . Pseudochol deficiency Neg Hx     History  Substance Use Topics  . Smoking status: Never Smoker   . Smokeless tobacco: Never Used  . Alcohol Use: 0.6 oz/week    1 Shots of liquor per week    Allergies: No Known Allergies  Prescriptions prior to admission  Medication Sig Dispense Refill  . acetaminophen (TYLENOL) 500 MG tablet Take 1,000 mg by mouth every 6 (six) hours as needed. For pain       . prenatal vitamin w/FE, FA (PRENATAL 1 + 1) 27-1 MG TABS Take 1 tablet by mouth daily.          Review of Systems  Constitutional: Negative.   Respiratory: Negative.   Cardiovascular: Negative.   Gastrointestinal: Positive for nausea, vomiting and abdominal pain. Negative for diarrhea and constipation.  Genitourinary: Negative for dysuria, urgency, frequency, hematuria and flank pain.       Negative for vaginal bleeding, vaginal discharge, dyspareunia  Musculoskeletal: Negative.   Neurological: Positive for dizziness.  Psychiatric/Behavioral: Negative.    Physical Exam   Blood pressure 145/98, pulse 98, temperature 98.9 F (37.2 C), resp. rate 18, height 5\' 2"  (1.575 m), weight 233 lb (105.688 kg), last menstrual period 10/01/2010, SpO2 98.00%.  Physical Exam  Nursing note and vitals reviewed. Constitutional: She is oriented to person, place, and time. She appears well-developed and well-nourished. No  distress.  Cardiovascular: Normal rate.   Respiratory: Effort normal.  Musculoskeletal: Normal range of motion.  Neurological: She is alert and oriented to person, place, and time.  Skin: Skin is warm and dry.  Psychiatric: She has a normal mood and affect.    MAU Course  Procedures Results for orders placed during the hospital encounter of 01/12/11 (from the past 24 hour(s))  URINALYSIS, ROUTINE W REFLEX MICROSCOPIC     Status: Abnormal   Collection Time   01/12/11  7:30 PM      Component Value Range   Color, Urine YELLOW  YELLOW    APPearance CLEAR  CLEAR    Specific Gravity, Urine >1.030 (*) 1.005 - 1.030    pH 6.0  5.0 - 8.0    Glucose, UA NEGATIVE  NEGATIVE (mg/dL)   Hgb urine dipstick SMALL (*) NEGATIVE    Bilirubin Urine NEGATIVE  NEGATIVE    Ketones, ur NEGATIVE  NEGATIVE (mg/dL)   Protein, ur 161 (*) NEGATIVE (mg/dL)   Urobilinogen, UA 0.2  0.0 - 1.0 (mg/dL)   Nitrite NEGATIVE  NEGATIVE    Leukocytes, UA NEGATIVE  NEGATIVE   URINE MICROSCOPIC-ADD ON     Status: Abnormal   Collection Time   01/12/11  7:30 PM      Component Value Range   Squamous Epithelial / LPF FEW (*) RARE    WBC, UA 0-2  <3 (  WBC/hpf)   RBC / HPF 0-2  <3 (RBC/hpf)   Bacteria, UA FEW (*) RARE    Urine-Other MUCOUS PRESENT      Zofran ODT ordered, pt states she is feeling better afterwards, will try clear liquids, if no vomiting, will d/c home with rx  Assessment and Plan  30 y.o. G2P1001 at [redacted]w[redacted]d Nausea and vomiting of pregnancy - rx Zofran ODT 4 mg Start prenatal care as soon as possible Kinsey Karch 01/12/2011, 8:09 PM

## 2011-01-12 NOTE — Progress Notes (Signed)
Pt reports for one week she has had stomach pain, nausea, but no vomiting. Stomach pain is mid-abd. This am she felt dizzy "I felt like I was going to pass out". Denies fever, diarrhea, vaginal bleeding or other symptoms. G2P1 (previous c/s). Has not started prenatal care.

## 2011-02-09 LAB — OB RESULTS CONSOLE HEPATITIS B SURFACE ANTIGEN: Hepatitis B Surface Ag: NEGATIVE

## 2011-04-21 ENCOUNTER — Encounter (HOSPITAL_COMMUNITY): Payer: Self-pay | Admitting: *Deleted

## 2011-04-21 ENCOUNTER — Inpatient Hospital Stay (HOSPITAL_COMMUNITY)
Admission: AD | Admit: 2011-04-21 | Discharge: 2011-04-21 | Disposition: A | Discharge status: Home / Self Care | Payer: Medicaid Other | Source: Ambulatory Visit | Attending: Obstetrics and Gynecology | Admitting: Obstetrics and Gynecology

## 2011-04-21 DIAGNOSIS — A499 Bacterial infection, unspecified: Secondary | ICD-10-CM

## 2011-04-21 DIAGNOSIS — O4700 False labor before 37 completed weeks of gestation, unspecified trimester: Secondary | ICD-10-CM | POA: Insufficient documentation

## 2011-04-21 DIAGNOSIS — O2390 Unspecified genitourinary tract infection in pregnancy, unspecified trimester: Secondary | ICD-10-CM | POA: Insufficient documentation

## 2011-04-21 DIAGNOSIS — N760 Acute vaginitis: Secondary | ICD-10-CM | POA: Insufficient documentation

## 2011-04-21 DIAGNOSIS — O479 False labor, unspecified: Secondary | ICD-10-CM

## 2011-04-21 DIAGNOSIS — O368190 Decreased fetal movements, unspecified trimester, not applicable or unspecified: Secondary | ICD-10-CM | POA: Insufficient documentation

## 2011-04-21 DIAGNOSIS — B9689 Other specified bacterial agents as the cause of diseases classified elsewhere: Secondary | ICD-10-CM | POA: Insufficient documentation

## 2011-04-21 LAB — URINALYSIS, ROUTINE W REFLEX MICROSCOPIC
Bilirubin Urine: NEGATIVE
Glucose, UA: NEGATIVE mg/dL
Ketones, ur: NEGATIVE mg/dL
Protein, ur: NEGATIVE mg/dL

## 2011-04-21 LAB — WET PREP, GENITAL

## 2011-04-21 MED ORDER — METRONIDAZOLE 500 MG PO TABS: 500.0000 mg | ORAL_TABLET | Freq: Two times a day (BID) | ORAL | Status: AC

## 2011-04-21 NOTE — MAU Note (Signed)
Pt states she has had some abdominal tightening and feel the baby has not been moving as much as it usually does

## 2011-04-21 NOTE — MAU Provider Note (Signed)
Jamie Hays is a 31 y.o. year old G3P1001 female at [redacted]w[redacted]d weeks gestation who presents to MAU reporting uterine tightening since last night after IC and decreased FM today.  First Provider Initiated Contact with Patient 04/21/11 2041     Maternal Medical History:  Reason for admission: Reason for Admission:   nausea  OB History    Grav Para Term Preterm Abortions TAB SAB Ect Mult Living   2 1 1       1      Past Medical History  Diagnosis Date  . Urinary tract infection   . Hypertension   . Headache    Past Surgical History  Procedure Date  . Cesarean section   . Breast reduction surgery 2000   Family History: family history is negative for Anesthesia problems, and Hypotension, and Malignant hyperthermia, and Pseudochol deficiency, . Social History:  reports that she has never smoked. She has never used smokeless tobacco. She reports that she drinks about .6 ounces of alcohol per week. She reports that she does not use illicit drugs.  Review of Systems  Constitutional: Negative for fever and chills.  Gastrointestinal: Negative for nausea, vomiting, diarrhea and constipation.  Genitourinary: Negative for dysuria, urgency, frequency, hematuria and flank pain.    Dilation: Closed Effacement (%): 50 Station: -3 Exam by:: V.Alphus Zeck CNM Blood pressure 132/73, pulse 94, temperature 97.7 F (36.5 C), temperature source Oral, resp. rate 18, last menstrual period 10/01/2010, SpO2 99.00%. Maternal Exam:  Uterine Assessment: UI  Abdomen: Fundal height is S=D.    Introitus: Normal vulva. Normal vagina.  Vagina is negative for discharge.  Pelvis: adequate for delivery.   Cervix: Cervix evaluated by sterile speculum exam and digital exam.     Fetal Exam Fetal Monitor Review: Mode: ultrasound.   Baseline rate: 140.  Variability: moderate (6-25 bpm).   Pattern: accelerations present and no decelerations.    Fetal State Assessment: Category I - tracings are  normal.     Physical Exam  Constitutional: She is oriented to person, place, and time. She appears well-developed and well-nourished. No distress.  Cardiovascular: Normal rate.   Respiratory: Effort normal.  GI: Soft. There is no tenderness.  Genitourinary: Vagina normal and uterus normal. There is no rash on the right labia. There is no rash on the left labia. Uterus is not tender. Cervix exhibits no motion tenderness, no discharge and no friability. No bleeding around the vagina. No vaginal discharge found.  Neurological: She is alert and oriented to person, place, and time.  Skin: Skin is warm and dry.  Psychiatric: She has a normal mood and affect.   Dilation: Closed Effacement (%): 50 Cervical Position: Posterior Station: -3 Presentation: Undeterminable Exam by:: V.Valary Manahan CNM  Prenatal labs: ABO, Rh: --/--/O POS (10/27 1852) Antibody:   Rubella:   RPR:    HBsAg:    HIV:    GBS:     Results for orders placed during the hospital encounter of 04/21/11 (from the past 24 hour(s))  WET PREP, GENITAL     Status: Abnormal   Collection Time   04/21/11  9:00 PM      Component Value Range   Yeast Wet Prep HPF POC NONE SEEN  NONE SEEN    Trich, Wet Prep NONE SEEN  NONE SEEN    Clue Cells Wet Prep HPF POC FEW (*) NONE SEEN    WBC, Wet Prep HPF POC FEW (*) NONE SEEN   URINALYSIS, ROUTINE W REFLEX MICROSCOPIC  Status: Abnormal   Collection Time   04/21/11  9:25 PM      Component Value Range   Color, Urine YELLOW  YELLOW    APPearance CLEAR  CLEAR    Specific Gravity, Urine 1.015  1.005 - 1.030    pH 6.0  5.0 - 8.0    Glucose, UA NEGATIVE  NEGATIVE (mg/dL)   Hgb urine dipstick TRACE (*) NEGATIVE    Bilirubin Urine NEGATIVE  NEGATIVE    Ketones, ur NEGATIVE  NEGATIVE (mg/dL)   Protein, ur NEGATIVE  NEGATIVE (mg/dL)   Urobilinogen, UA 0.2  0.0 - 1.0 (mg/dL)   Nitrite NEGATIVE  NEGATIVE    Leukocytes, UA NEGATIVE  NEGATIVE   URINE MICROSCOPIC-ADD ON     Status: Abnormal    Collection Time   04/21/11  9:25 PM      Component Value Range   Squamous Epithelial / LPF FEW (*) RARE    WBC, UA 0-2  <3 (WBC/hpf)   RBC / HPF 3-6  <3 (RBC/hpf)   Bacteria, UA FEW (*) RARE     Assessment/Plan: 1. BV (bacterial vaginosis)   2. Preterm uterine contractions, antepartum   3. Decreased fetal movement in pregnancy, antepartum    F/U in office as scheduled in 1 week or MAU PRN PTL precautions, FKCs Rx Flagyl  Delma Villalva 04/21/2011, 9:44 PM

## 2011-04-21 NOTE — Discharge Instructions (Signed)
Bacterial Vaginosis Bacterial vaginosis (BV) is a vaginal infection where the normal balance of bacteria in the vagina is disrupted. The normal balance is then replaced by an overgrowth of certain bacteria. There are several different kinds of bacteria that can cause BV. BV is the most common vaginal infection in women of childbearing age. CAUSES   The cause of BV is not fully understood. BV develops when there is an increase or imbalance of harmful bacteria.   Some activities or behaviors can upset the normal balance of bacteria in the vagina and put women at increased risk including:   Having a new sex partner or multiple sex partners.   Douching.   Using an intrauterine device (IUD) for contraception.   It is not clear what role sexual activity plays in the development of BV. However, women that have never had sexual intercourse are rarely infected with BV.  Women do not get BV from toilet seats, bedding, swimming pools or from touching objects around them.  SYMPTOMS   Grey vaginal discharge.   A fish-like odor with discharge, especially after sexual intercourse.   Itching or burning of the vagina and vulva.   Burning or pain with urination.   Some women have no signs or symptoms at all.  DIAGNOSIS  Your caregiver must examine the vagina for signs of BV. Your caregiver will perform lab tests and look at the sample of vaginal fluid through a microscope. They will look for bacteria and abnormal cells (clue cells), a pH test higher than 4.5, and a positive amine test all associated with BV.  RISKS AND COMPLICATIONS   Pelvic inflammatory disease (PID).   Infections following gynecology surgery.   Developing HIV.   Developing herpes virus.  TREATMENT  Sometimes BV will clear up without treatment. However, all women with symptoms of BV should be treated to avoid complications, especially if gynecology surgery is planned. Female partners generally do not need to be treated. However,  BV may spread between female sex partners so treatment is helpful in preventing a recurrence of BV.   BV may be treated with antibiotics. The antibiotics come in either pill or vaginal cream forms. Either can be used with nonpregnant or pregnant women, but the recommended dosages differ. These antibiotics are not harmful to the baby.   BV can recur after treatment. If this happens, a second round of antibiotics will often be prescribed.   Treatment is important for pregnant women. If not treated, BV can cause a premature delivery, especially for a pregnant woman who had a premature birth in the past. All pregnant women who have symptoms of BV should be checked and treated.   For chronic reoccurrence of BV, treatment with a type of prescribed gel vaginally twice a week is helpful.  HOME CARE INSTRUCTIONS   Finish all medication as directed by your caregiver.   Do not have sex until treatment is completed.   Tell your sexual partner that you have a vaginal infection. They should see their caregiver and be treated if they have problems, such as a mild rash or itching.   Practice safe sex. Use condoms. Only have 1 sex partner.  PREVENTION  Basic prevention steps can help reduce the risk of upsetting the natural balance of bacteria in the vagina and developing BV:  Do not have sexual intercourse (be abstinent).   Do not douche.   Use all of the medicine prescribed for treatment of BV, even if the signs and symptoms go away.     Tell your sex partner if you have BV. That way, they can be treated, if needed, to prevent reoccurrence.  SEEK MEDICAL CARE IF:   Your symptoms are not improving after 3 days of treatment.   You have increased discharge, pain, or fever.  MAKE SURE YOU:   Understand these instructions.   Will watch your condition.   Will get help right away if you are not doing well or get worse.  FOR MORE INFORMATION  Division of STD Prevention (DSTDP), Centers for Disease  Control and Prevention: www.cdc.gov/std American Social Health Association (ASHA): www.ashastd.org  Document Released: 01/18/2005 Document Revised: 01/07/2011 Document Reviewed: 07/11/2008 ExitCare Patient Information 2012 [employer].Bacterial Vaginosis Bacterial vaginosis (BV) is a vaginal infection where the normal balance of bacteria in the vagina is disrupted. The normal balance is then replaced by an overgrowth of certain bacteria. There are several different kinds of bacteria that can cause BV. BV is the most common vaginal infection in women of childbearing age. CAUSES   The cause of BV is not fully understood. BV develops when there is an increase or imbalance of harmful bacteria.   Some activities or behaviors can upset the normal balance of bacteria in the vagina and put women at increased risk including:   Having a new sex partner or multiple sex partners.   Douching.   Using an intrauterine device (IUD) for contraception.   It is not clear what role sexual activity plays in the development of BV. However, women that have never had sexual intercourse are rarely infected with BV.  Women do not get BV from toilet seats, bedding, swimming pools or from touching objects around them.  SYMPTOMS   Grey vaginal discharge.   A fish-like odor with discharge, especially after sexual intercourse.   Itching or burning of the vagina and vulva.   Burning or pain with urination.   Some women have no signs or symptoms at all.  DIAGNOSIS  Your caregiver must examine the vagina for signs of BV. Your caregiver will perform lab tests and look at the sample of vaginal fluid through a microscope. They will look for bacteria and abnormal cells (clue cells), a pH test higher than 4.5, and a positive amine test all associated with BV.  RISKS AND COMPLICATIONS   Pelvic inflammatory disease (PID).   Infections following gynecology surgery.   Developing HIV.   Developing herpes virus.    TREATMENT  Sometimes BV will clear up without treatment. However, all women with symptoms of BV should be treated to avoid complications, especially if gynecology surgery is planned. Female partners generally do not need to be treated. However, BV may spread between female sex partners so treatment is helpful in preventing a recurrence of BV.   BV may be treated with antibiotics. The antibiotics come in either pill or vaginal cream forms. Either can be used with nonpregnant or pregnant women, but the recommended dosages differ. These antibiotics are not harmful to the baby.   BV can recur after treatment. If this happens, a second round of antibiotics will often be prescribed.   Treatment is important for pregnant women. If not treated, BV can cause a premature delivery, especially for a pregnant woman who had a premature birth in the past. All pregnant women who have symptoms of BV should be checked and treated.   For chronic reoccurrence of BV, treatment with a type of prescribed gel vaginally twice a week is helpful.  HOME CARE INSTRUCTIONS   Finish all   medication as directed by your caregiver.   Do not have sex until treatment is completed.   Tell your sexual partner that you have a vaginal infection. They should see their caregiver and be treated if they have problems, such as a mild rash or itching.   Practice safe sex. Use condoms. Only have 1 sex partner.  PREVENTION  Basic prevention steps can help reduce the risk of upsetting the natural balance of bacteria in the vagina and developing BV:  Do not have sexual intercourse (be abstinent).   Do not douche.   Use all of the medicine prescribed for treatment of BV, even if the signs and symptoms go away.   Tell your sex partner if you have BV. That way, they can be treated, if needed, to prevent reoccurrence.  SEEK MEDICAL CARE IF:   Your symptoms are not improving after 3 days of treatment.   You have increased discharge, pain,  or fever.  MAKE SURE YOU:   Understand these instructions.   Will watch your condition.   Will get help right away if you are not doing well or get worse.  FOR MORE INFORMATION  Division of STD Prevention (DSTDP), Centers for Disease Control and Prevention: www.cdc.gov/std American Social Health Association (ASHA): www.ashastd.org  Document Released: 01/18/2005 Document Revised: 01/07/2011 Document Reviewed: 07/11/2008 ExitCare Patient Information 2012 [employer]. 

## 2011-04-22 LAB — URINE CULTURE
Colony Count: NO GROWTH
Culture  Setup Time: 201303210204

## 2011-05-12 ENCOUNTER — Encounter: Payer: Medicaid Other | Attending: Obstetrics and Gynecology | Admitting: *Deleted

## 2011-05-12 DIAGNOSIS — O99810 Abnormal glucose complicating pregnancy: Secondary | ICD-10-CM | POA: Insufficient documentation

## 2011-05-12 DIAGNOSIS — Z713 Dietary counseling and surveillance: Secondary | ICD-10-CM | POA: Insufficient documentation

## 2011-05-17 ENCOUNTER — Encounter: Payer: Self-pay | Admitting: *Deleted

## 2011-05-17 NOTE — Progress Notes (Signed)
  Patient was seen on 05/12/2011 for Gestational Diabetes self-management class at the [facility]. The following learning objectives were met by the patient during this course:   States the definition of Gestational Diabetes  States why dietary management is important in controlling blood glucose  Describes the effects each nutrient has on blood glucose levels  Demonstrates ability to create a balanced meal plan  Demonstrates carbohydrate counting   States when to check blood glucose levels  Demonstrates proper blood glucose monitoring techniques  States the effect of stress and exercise on blood glucose levels  States the importance of limiting caffeine and abstaining from alcohol and smoking  Blood glucose monitor given: Accu Chek Nano BG Monitoring Kit Lot # [id number] Exp: 07/2012 Blood glucose reading: 85 mg/dl  Patient instructed to monitor glucose levels: FBS: 60 - <90 1 hour: <140  *Patient received handouts:  Nutrition Diabetes and Pregnancy  Carbohydrate Counting List  Patient will be seen for follow-up as needed.

## 2011-05-17 NOTE — Patient Instructions (Signed)
Goals:  Check glucose levels per MD as instructed  Follow Gestational Diabetes Diet as instructed  Call for follow-up as needed    

## 2011-06-29 ENCOUNTER — Encounter (HOSPITAL_COMMUNITY): Payer: Self-pay

## 2011-07-02 ENCOUNTER — Inpatient Hospital Stay (HOSPITAL_COMMUNITY)
Admission: AD | Admit: 2011-07-02 | Discharge: 2011-07-06 | DRG: 766 | Disposition: A | Discharge status: Home / Self Care | Payer: Medicaid Other | Source: Ambulatory Visit | Attending: Obstetrics and Gynecology | Admitting: Obstetrics and Gynecology

## 2011-07-02 ENCOUNTER — Encounter (HOSPITAL_COMMUNITY): Payer: Self-pay

## 2011-07-02 DIAGNOSIS — Z98891 History of uterine scar from previous surgery: Secondary | ICD-10-CM

## 2011-07-02 DIAGNOSIS — O4290 Premature rupture of membranes, unspecified as to length of time between rupture and onset of labor, unspecified weeks of gestation: Principal | ICD-10-CM | POA: Diagnosis present

## 2011-07-02 DIAGNOSIS — O99814 Abnormal glucose complicating childbirth: Secondary | ICD-10-CM | POA: Diagnosis present

## 2011-07-02 DIAGNOSIS — O34219 Maternal care for unspecified type scar from previous cesarean delivery: Secondary | ICD-10-CM | POA: Diagnosis present

## 2011-07-02 LAB — MRSA PCR SCREENING: MRSA by PCR: NEGATIVE

## 2011-07-02 LAB — CBC
HCT: 37.6 % (ref 36.0–46.0)
MCH: 29.2 pg (ref 26.0–34.0)
MCHC: 33.2 g/dL (ref 30.0–36.0)
RDW: 15.5 % (ref 11.5–15.5)

## 2011-07-02 LAB — GLUCOSE, CAPILLARY: Glucose-Capillary: 80 mg/dL (ref 70–99)

## 2011-07-02 MED ORDER — OXYTOCIN BOLUS FROM INFUSION
500.0000 mL | Freq: Once | INTRAVENOUS | Status: DC
  Filled 2011-07-02: qty 500

## 2011-07-02 MED ORDER — OXYTOCIN 20 UNITS IN LACTATED RINGERS INFUSION - SIMPLE
1.0000 m[IU]/min | INTRAVENOUS | Status: DC
  Administered 2011-07-02: 1 m[IU]/min via INTRAVENOUS
  Filled 2011-07-02: qty 1000

## 2011-07-02 MED ORDER — LACTATED RINGERS IV SOLN: INTRAVENOUS | Status: DC

## 2011-07-02 MED ORDER — LIDOCAINE HCL (PF) 1 % IJ SOLN: 30.0000 mL | INTRAMUSCULAR | Status: DC | PRN

## 2011-07-02 MED ORDER — ONDANSETRON HCL 4 MG/2ML IJ SOLN: 4.0000 mg | Freq: Four times a day (QID) | INTRAMUSCULAR | Status: DC | PRN

## 2011-07-02 MED ORDER — CITRIC ACID-SODIUM CITRATE 334-500 MG/5ML PO SOLN
30.0000 mL | ORAL | Status: DC | PRN
  Administered 2011-07-03: 30 mL via ORAL
  Filled 2011-07-02: qty 15

## 2011-07-02 MED ORDER — OXYTOCIN 20 UNITS IN LACTATED RINGERS INFUSION - SIMPLE: 125.0000 mL/h | Freq: Once | INTRAVENOUS | Status: DC

## 2011-07-02 MED ORDER — FLEET ENEMA 7-19 GM/118ML RE ENEM: 1.0000 | ENEMA | RECTAL | Status: DC | PRN

## 2011-07-02 MED ORDER — ACETAMINOPHEN 325 MG PO TABS: 650.0000 mg | ORAL_TABLET | ORAL | Status: DC | PRN

## 2011-07-02 MED ORDER — IBUPROFEN 600 MG PO TABS: 600.0000 mg | ORAL_TABLET | Freq: Four times a day (QID) | ORAL | Status: DC | PRN

## 2011-07-02 MED ORDER — TERBUTALINE SULFATE 1 MG/ML IJ SOLN: 0.2500 mg | Freq: Once | INTRAMUSCULAR | Status: AC | PRN

## 2011-07-02 MED ORDER — LACTATED RINGERS IV SOLN: 500.0000 mL | INTRAVENOUS | Status: DC | PRN

## 2011-07-02 MED ORDER — OXYCODONE-ACETAMINOPHEN 5-325 MG PO TABS: 1.0000 | ORAL_TABLET | ORAL | Status: DC | PRN

## 2011-07-02 NOTE — Progress Notes (Signed)
DR HENLEY NOTIFIED OF PATIENT, HER DESIRE TO TRIAL LABOR, TRACING, CTX PATTERN, SVE RESULT, FERN POSITIVE, BP READINGS. ORDER TO CALL DR HENLEY BACK WITH PATIENT FINAL DECISION OF EITHER C/SECTION OR TRIAL OF LABOR.

## 2011-07-02 NOTE — MAU Note (Signed)
Pt states, " I started having trickles of water at 5:30 pm. It happens every few minutes. "

## 2011-07-02 NOTE — MAU Note (Signed)
Patient is in with c/o leaking clear watery fluids since 1700pm. She denies any contractions, or bleeding. She reports good fetal movement.

## 2011-07-03 ENCOUNTER — Encounter (HOSPITAL_COMMUNITY)
Admission: AD | Disposition: A | Discharge status: Home / Self Care | Payer: Self-pay | Source: Ambulatory Visit | Attending: Obstetrics and Gynecology

## 2011-07-03 ENCOUNTER — Inpatient Hospital Stay (HOSPITAL_COMMUNITY): Payer: Medicaid Other | Admitting: Anesthesiology

## 2011-07-03 ENCOUNTER — Encounter (HOSPITAL_COMMUNITY): Payer: Self-pay | Admitting: Anesthesiology

## 2011-07-03 ENCOUNTER — Encounter (HOSPITAL_COMMUNITY): Payer: Self-pay | Admitting: *Deleted

## 2011-07-03 LAB — GLUCOSE, CAPILLARY: Glucose-Capillary: 125 mg/dL — ABNORMAL HIGH (ref 70–99)

## 2011-07-03 LAB — RPR: RPR Ser Ql: NONREACTIVE

## 2011-07-03 SURGERY — Surgical Case
Anesthesia: Spinal | Site: Abdomen | Wound class: Clean Contaminated

## 2011-07-03 MED ORDER — FENTANYL CITRATE 0.05 MG/ML IJ SOLN
INTRAMUSCULAR | Status: AC
  Filled 2011-07-03: qty 2

## 2011-07-03 MED ORDER — KETOROLAC TROMETHAMINE 60 MG/2ML IM SOLN
60.0000 mg | Freq: Once | INTRAMUSCULAR | Status: AC | PRN
  Administered 2011-07-03: 60 mg via INTRAMUSCULAR

## 2011-07-03 MED ORDER — ONDANSETRON HCL 4 MG/2ML IJ SOLN: 4.0000 mg | INTRAMUSCULAR | Status: DC | PRN

## 2011-07-03 MED ORDER — DIBUCAINE 1 % RE OINT: 1.0000 "application " | TOPICAL_OINTMENT | RECTAL | Status: DC | PRN

## 2011-07-03 MED ORDER — MENTHOL 3 MG MT LOZG: 1.0000 | LOZENGE | OROMUCOSAL | Status: DC | PRN

## 2011-07-03 MED ORDER — SCOPOLAMINE 1 MG/3DAYS TD PT72
1.0000 | MEDICATED_PATCH | Freq: Once | TRANSDERMAL | Status: AC
  Administered 2011-07-03: 1.5 mg via TRANSDERMAL

## 2011-07-03 MED ORDER — IBUPROFEN 600 MG PO TABS: 600.0000 mg | ORAL_TABLET | Freq: Four times a day (QID) | ORAL | Status: DC | PRN

## 2011-07-03 MED ORDER — SIMETHICONE 80 MG PO CHEW: 80.0000 mg | CHEWABLE_TABLET | ORAL | Status: DC | PRN

## 2011-07-03 MED ORDER — DIPHENHYDRAMINE HCL 25 MG PO CAPS: 25.0000 mg | ORAL_CAPSULE | ORAL | Status: DC | PRN

## 2011-07-03 MED ORDER — OXYCODONE-ACETAMINOPHEN 5-325 MG PO TABS
1.0000 | ORAL_TABLET | ORAL | Status: DC | PRN
  Administered 2011-07-03 (×3): 1 via ORAL
  Administered 2011-07-04 – 2011-07-06 (×13): 2 via ORAL
  Filled 2011-07-03: qty 1
  Filled 2011-07-03 (×10): qty 2
  Filled 2011-07-03: qty 1
  Filled 2011-07-03: qty 2
  Filled 2011-07-03: qty 1
  Filled 2011-07-03 (×2): qty 2

## 2011-07-03 MED ORDER — KETOROLAC TROMETHAMINE 30 MG/ML IJ SOLN: 30.0000 mg | Freq: Four times a day (QID) | INTRAMUSCULAR | Status: AC | PRN

## 2011-07-03 MED ORDER — OXYTOCIN 20 UNITS IN LACTATED RINGERS INFUSION - SIMPLE
INTRAVENOUS | Status: AC
  Filled 2011-07-03: qty 1000

## 2011-07-03 MED ORDER — ONDANSETRON HCL 4 MG PO TABS: 4.0000 mg | ORAL_TABLET | ORAL | Status: DC | PRN

## 2011-07-03 MED ORDER — SODIUM CHLORIDE 0.9 % IV SOLN
1.0000 ug/kg/h | INTRAVENOUS | Status: DC | PRN
  Filled 2011-07-03: qty 2.5

## 2011-07-03 MED ORDER — SODIUM CHLORIDE 0.9 % IJ SOLN: 3.0000 mL | INTRAMUSCULAR | Status: DC | PRN

## 2011-07-03 MED ORDER — CEFAZOLIN SODIUM 1-5 GM-% IV SOLN
INTRAVENOUS | Status: DC | PRN
  Administered 2011-07-03: 2 g via INTRAVENOUS

## 2011-07-03 MED ORDER — METOCLOPRAMIDE HCL 5 MG/ML IJ SOLN: 10.0000 mg | Freq: Three times a day (TID) | INTRAMUSCULAR | Status: DC | PRN

## 2011-07-03 MED ORDER — ONDANSETRON HCL 4 MG/2ML IJ SOLN
INTRAMUSCULAR | Status: DC | PRN
  Administered 2011-07-03: 4 mg via INTRAVENOUS

## 2011-07-03 MED ORDER — CEFAZOLIN SODIUM-DEXTROSE 2-3 GM-% IV SOLR
2.0000 g | INTRAVENOUS | Status: AC
  Filled 2011-07-03: qty 50

## 2011-07-03 MED ORDER — TETANUS-DIPHTH-ACELL PERTUSSIS 5-2.5-18.5 LF-MCG/0.5 IM SUSP
0.5000 mL | Freq: Once | INTRAMUSCULAR | Status: AC
  Administered 2011-07-04: 0.5 mL via INTRAMUSCULAR

## 2011-07-03 MED ORDER — CEFAZOLIN SODIUM-DEXTROSE 2-3 GM-% IV SOLR
2.0000 g | Freq: Three times a day (TID) | INTRAVENOUS | Status: AC
  Administered 2011-07-03 (×2): 2 g via INTRAVENOUS
  Filled 2011-07-03 (×2): qty 50

## 2011-07-03 MED ORDER — ONDANSETRON HCL 4 MG/2ML IJ SOLN: 4.0000 mg | Freq: Three times a day (TID) | INTRAMUSCULAR | Status: DC | PRN

## 2011-07-03 MED ORDER — WITCH HAZEL-GLYCERIN EX PADS: 1.0000 "application " | MEDICATED_PAD | CUTANEOUS | Status: DC | PRN

## 2011-07-03 MED ORDER — SIMETHICONE 80 MG PO CHEW
80.0000 mg | CHEWABLE_TABLET | Freq: Three times a day (TID) | ORAL | Status: DC
  Administered 2011-07-03: 160 mg via ORAL
  Administered 2011-07-04 – 2011-07-06 (×10): 80 mg via ORAL

## 2011-07-03 MED ORDER — DIPHENHYDRAMINE HCL 50 MG/ML IJ SOLN: 12.5000 mg | INTRAMUSCULAR | Status: DC | PRN

## 2011-07-03 MED ORDER — MEPERIDINE HCL 25 MG/ML IJ SOLN: 6.2500 mg | INTRAMUSCULAR | Status: DC | PRN

## 2011-07-03 MED ORDER — NALBUPHINE SYRINGE 5 MG/0.5 ML
5.0000 mg | INJECTION | INTRAMUSCULAR | Status: DC | PRN
  Filled 2011-07-03: qty 1

## 2011-07-03 MED ORDER — SCOPOLAMINE 1 MG/3DAYS TD PT72
MEDICATED_PATCH | TRANSDERMAL | Status: AC
  Filled 2011-07-03: qty 1

## 2011-07-03 MED ORDER — PHENYLEPHRINE HCL 10 MG/ML IJ SOLN
INTRAMUSCULAR | Status: DC | PRN
  Administered 2011-07-03 (×3): 40 ug via INTRAVENOUS

## 2011-07-03 MED ORDER — ZOLPIDEM TARTRATE 5 MG PO TABS: 5.0000 mg | ORAL_TABLET | Freq: Every evening | ORAL | Status: DC | PRN

## 2011-07-03 MED ORDER — 0.9 % SODIUM CHLORIDE (POUR BTL) OPTIME
TOPICAL | Status: DC | PRN
  Administered 2011-07-03: 1000 mL

## 2011-07-03 MED ORDER — OXYTOCIN 20 UNITS IN LACTATED RINGERS INFUSION - SIMPLE
125.0000 mL/h | INTRAVENOUS | Status: AC
  Administered 2011-07-03: 125 mL/h via INTRAVENOUS

## 2011-07-03 MED ORDER — KETOROLAC TROMETHAMINE 60 MG/2ML IM SOLN
INTRAMUSCULAR | Status: AC
  Filled 2011-07-03: qty 2

## 2011-07-03 MED ORDER — FENTANYL CITRATE 0.05 MG/ML IJ SOLN
INTRAMUSCULAR | Status: DC | PRN
  Administered 2011-07-03: 15 ug via INTRATHECAL

## 2011-07-03 MED ORDER — PRENATAL MULTIVITAMIN CH
1.0000 | ORAL_TABLET | Freq: Every day | ORAL | Status: DC
  Administered 2011-07-04 – 2011-07-06 (×3): 1 via ORAL
  Filled 2011-07-03 (×3): qty 1

## 2011-07-03 MED ORDER — EPHEDRINE SULFATE 50 MG/ML IJ SOLN
INTRAMUSCULAR | Status: DC | PRN
  Administered 2011-07-03 (×2): 10 mg via INTRAVENOUS

## 2011-07-03 MED ORDER — NALBUPHINE HCL 10 MG/ML IJ SOLN
5.0000 mg | INTRAMUSCULAR | Status: DC | PRN
  Filled 2011-07-03: qty 1

## 2011-07-03 MED ORDER — IBUPROFEN 600 MG PO TABS
600.0000 mg | ORAL_TABLET | Freq: Four times a day (QID) | ORAL | Status: DC
  Administered 2011-07-03 – 2011-07-06 (×13): 600 mg via ORAL
  Filled 2011-07-03 (×12): qty 1

## 2011-07-03 MED ORDER — MEASLES, MUMPS & RUBELLA VAC ~~LOC~~ INJ
0.5000 mL | INJECTION | Freq: Once | SUBCUTANEOUS | Status: DC
  Filled 2011-07-03: qty 0.5

## 2011-07-03 MED ORDER — LANOLIN HYDROUS EX OINT: 1.0000 "application " | TOPICAL_OINTMENT | CUTANEOUS | Status: DC | PRN

## 2011-07-03 MED ORDER — FENTANYL CITRATE 0.05 MG/ML IJ SOLN
25.0000 ug | INTRAMUSCULAR | Status: DC | PRN
  Administered 2011-07-03 (×4): 50 ug via INTRAVENOUS

## 2011-07-03 MED ORDER — LACTATED RINGERS IV SOLN
INTRAVENOUS | Status: DC | PRN
  Administered 2011-07-03: 01:00:00 via INTRAVENOUS

## 2011-07-03 MED ORDER — OXYTOCIN 20 UNITS IN LACTATED RINGERS INFUSION - SIMPLE
INTRAVENOUS | Status: DC | PRN
  Administered 2011-07-03 (×2): 20 [IU] via INTRAVENOUS

## 2011-07-03 MED ORDER — SENNOSIDES-DOCUSATE SODIUM 8.6-50 MG PO TABS
2.0000 | ORAL_TABLET | Freq: Every day | ORAL | Status: DC
Start: 2011-07-03 — End: 2011-07-06
  Administered 2011-07-03 – 2011-07-05 (×3): 2 via ORAL

## 2011-07-03 MED ORDER — BUPIVACAINE IN DEXTROSE 0.75-8.25 % IT SOLN
INTRATHECAL | Status: DC | PRN
  Administered 2011-07-03: 11.75 mg via INTRATHECAL

## 2011-07-03 MED ORDER — MORPHINE SULFATE 0.5 MG/ML IJ SOLN
INTRAMUSCULAR | Status: AC
  Filled 2011-07-03: qty 10

## 2011-07-03 MED ORDER — NALOXONE HCL 0.4 MG/ML IJ SOLN: 0.4000 mg | INTRAMUSCULAR | Status: DC | PRN

## 2011-07-03 MED ORDER — MORPHINE SULFATE (PF) 0.5 MG/ML IJ SOLN
INTRAMUSCULAR | Status: DC | PRN
  Administered 2011-07-03: .1 mg via INTRATHECAL

## 2011-07-03 MED ORDER — DIPHENHYDRAMINE HCL 25 MG PO CAPS: 25.0000 mg | ORAL_CAPSULE | Freq: Four times a day (QID) | ORAL | Status: DC | PRN

## 2011-07-03 MED ORDER — DIPHENHYDRAMINE HCL 50 MG/ML IJ SOLN: 25.0000 mg | INTRAMUSCULAR | Status: DC | PRN

## 2011-07-03 SURGICAL SUPPLY — 32 items
CLOTH BEACON ORANGE TIMEOUT ST (SAFETY) ×2 IMPLANT
CONTAINER PREFILL 10% NBF 15ML (MISCELLANEOUS) IMPLANT
DRESSING TELFA 8X3 (GAUZE/BANDAGES/DRESSINGS) IMPLANT
DRSG VASELINE 3X18 (GAUZE/BANDAGES/DRESSINGS) ×2 IMPLANT
ELECT REM PT RETURN 9FT ADLT (ELECTROSURGICAL) ×2
ELECTRODE REM PT RTRN 9FT ADLT (ELECTROSURGICAL) ×1 IMPLANT
EXTRACTOR VACUUM KIWI (MISCELLANEOUS) IMPLANT
EXTRACTOR VACUUM M CUP 4 TUBE (SUCTIONS) IMPLANT
GAUZE SPONGE 4X4 12PLY STRL LF (GAUZE/BANDAGES/DRESSINGS) ×4 IMPLANT
GLOVE BIO SURGEON STRL SZ7.5 (GLOVE) ×4 IMPLANT
GOWN PREVENTION PLUS LG XLONG (DISPOSABLE) ×4 IMPLANT
GOWN PREVENTION PLUS XLARGE (GOWN DISPOSABLE) ×2 IMPLANT
KIT ABG SYR 3ML LUER SLIP (SYRINGE) IMPLANT
NDL HYPO 25X5/8 SAFETYGLIDE (NEEDLE) IMPLANT
NEEDLE HYPO 25X5/8 SAFETYGLIDE (NEEDLE) IMPLANT
NS IRRIG 1000ML POUR BTL (IV SOLUTION) ×2 IMPLANT
PACK C SECTION WH (CUSTOM PROCEDURE TRAY) ×2 IMPLANT
PAD ABD 7.5X8 STRL (GAUZE/BANDAGES/DRESSINGS) IMPLANT
RTRCTR C-SECT PINK 25CM LRG (MISCELLANEOUS) ×1 IMPLANT
SLEEVE SCD COMPRESS KNEE MED (MISCELLANEOUS) ×1 IMPLANT
SPONGE LAP 18X18 X RAY DECT (DISPOSABLE) ×2 IMPLANT
STAPLER VISISTAT 35W (STAPLE) ×1 IMPLANT
SUT PLAIN 0 NONE (SUTURE) IMPLANT
SUT VIC AB 0 CT1 36 (SUTURE) ×16 IMPLANT
SUT VIC AB 3-0 CTX 36 (SUTURE) ×2 IMPLANT
SUT VIC AB 3-0 SH 27 (SUTURE)
SUT VIC AB 3-0 SH 27X BRD (SUTURE) IMPLANT
SUT VIC AB 4-0 KS 27 (SUTURE) IMPLANT
SUT VICRYL 0 TIES 12 18 (SUTURE) IMPLANT
TOWEL OR 17X24 6PK STRL BLUE (TOWEL DISPOSABLE) ×4 IMPLANT
TRAY FOLEY CATH 14FR (SET/KITS/TRAYS/PACK) ×2 IMPLANT
WATER STERILE IRR 1000ML POUR (IV SOLUTION) ×1 IMPLANT

## 2011-07-03 NOTE — Progress Notes (Signed)
Received pt. At 3pm, first ambulation to bathroom at 1630 assessment tolerated well.  Reported to this RN pt had been dizzy with standing prior.  Dc'd foley at 2030 assessment with good urine output and pt tolerating ambulation well at this time.

## 2011-07-03 NOTE — Progress Notes (Signed)
Patient ID: Jamie Hays, female   DOB: 1980/09/01, 31 y.o.   MRN: [id number] DOS afebrile BP normal output OK Urine concentrated Pt is comfortable. Abdomen soft and not tender.

## 2011-07-03 NOTE — Transfer of Care (Signed)
Immediate Anesthesia Transfer of Care Note  Patient: Jamie Hays  Procedure(s) Performed: Procedure(s) (LRB): CESAREAN SECTION (N/A)  Patient Location: PACU  Anesthesia Type: Spinal  Level of Consciousness: awake, alert  and oriented  Airway & Oxygen Therapy: Patient Spontanous Breathing  Post-op Assessment: Report given to PACU RN and Post -op Vital signs reviewed and stable  Post vital signs: Reviewed and stable  Complications: No apparent anesthesia complications

## 2011-07-03 NOTE — Anesthesia Preprocedure Evaluation (Signed)
Anesthesia Evaluation  Patient identified by MRN, date of birth, ID band Patient awake    Reviewed: Allergy & Precautions, H&P , NPO status , Patient's Chart, lab work & pertinent test results, reviewed documented beta blocker date and time   History of Anesthesia Complications Negative for: history of anesthetic complications  Airway Mallampati: III TM Distance: >3 FB Neck ROM: full    Dental  (+) Teeth Intact   Pulmonary neg pulmonary ROS,  breath sounds clear to auscultation        Cardiovascular negative cardio ROS  Rhythm:regular Rate:Normal     Neuro/Psych negative neurological ROS  negative psych ROS   GI/Hepatic negative GI ROS, Neg liver ROS,   Endo/Other  Morbid obesity  Renal/GU negative Renal ROS  negative genitourinary   Musculoskeletal   Abdominal   Peds  Hematology negative hematology ROS (+)   Anesthesia Other Findings   Reproductive/Obstetrics (+) Pregnancy (h/o c/s x1 with SROM)                           Anesthesia Physical Anesthesia Plan  ASA: III  Anesthesia Plan: Spinal   Post-op Pain Management:    Induction:   Airway Management Planned:   Additional Equipment:   Intra-op Plan:   Post-operative Plan:   Informed Consent: I have reviewed the patients History and Physical, chart, labs and discussed the procedure including the risks, benefits and alternatives for the proposed anesthesia with the patient or authorized representative who has indicated his/her understanding and acceptance.   Dental Advisory Given  Plan Discussed with: Surgeon and CRNA  Anesthesia Plan Comments:         Anesthesia Quick Evaluation

## 2011-07-03 NOTE — Op Note (Signed)
Operative note on Jamie Hays:  Date of the operation: 07/03/2011   Preoperative diagnosis: Prior C-section, active labor, declined VBAC  Postoperative diagnosis same  Operation: Low transverse cervical C-section  Operator: Korben Carcione  Anesthesia: Spinal Dr. Rodman Pickle  The patient was brought to the operating room and given a spinal anesthetic by Dr. Rodman Pickle. She was placed in the lateral tilt position to the left. Her legs were frog legged and the abdomen and urethra were prepped with Betadine solution and a Foley catheter was inserted to straight drain. The upper abdomen had been taped up to provide better exposure to the abdominal incision. The old incision was utilized after anesthesia was confirmed and the incision was carried through the skin subcutaneous tissue and fascia. The fascia was separated from the rectus muscle superiorly and inferiorly. The peritoneal cavity was opened and extended vertically. An Alexis retractor was placed in the lower uterine segment was visualized. A short transverse incision was made through the superficial layers of the myometrium and then I entered the amniotic sac with my finger. The incision was enlarged by pulling superiorly and inferiorly and the vertex was presenting. The vertex delivered without difficulty but the shoulders caused some delay.the infant was female and Dr. Lenice Pressman gave Apgars of 9 and 9 at one and 5 minutes. The placenta was removed intact, the inside of the uterus was inspected and found to be free of any additional products of conception, and I noted several brisk bleeders. The inferior part of the incision was difficult to identify, but after I fully identified it I was able to close the uterus without difficulty. The uterus was closed in 2 layers with a running locked suture of 0 Vicryl on the first layer nonlocking suture of th same material on the second layer. A bleeder at the right uterine angle was sutured with a figure-of-eight suture of  0 Vicryl and complete hemostasis was obtained with a figure-of-eight suture on the central part of the incision.  There was a small hematoma at the right uterine angle but this did not grow. Liberal irrigation confirmed hemostasis, the bladder was well free of the operative field, the Alexis retractor was removed, and the abdominal wall was closed in layers irrigating each layer. Interrupted sutures of 0 Vicryl were used for the rectus muscle and peritoneum in one layer, 2 running sutures of 0 Vicryl were used for the fascia, a running 3-0 Vicryl was used for the subcutaneous tissue, and staples were used for the skin. The patient seemed to tolerate the procedure well, blood loss was estimated at thousand at 1200 cc, and the patient was returned to recovery in satisfactory condition.

## 2011-07-03 NOTE — H&P (Signed)
[name]               ACCOUNT NO.:  [id number]  MEDICAL RECORD NO.:  [id number]  LOCATION:  9169                          FACILITY:  [facility]  PHYSICIAN:  Malachi Pro. Ambrose Mantle, M.D. DATE OF BIRTH:  1981-01-19  DATE OF ADMISSION:  07/02/2011 DATE OF DISCHARGE:                             HISTORY & PHYSICAL   PRESENT ILLNESS:  This is a 31 year old black female, para 1 0-0-1, gravida 2, EDC July 20, 2011, by an ultrasound at 6 weeks 4 days, done on November 28, 2010.  The patient failed a 3-hour GTT and has kept track of her sugars with diet alone.  Almost all the blood sugars have been normal.  Blood group and type is O positive.  Negative antibody.  Pap smear normal.  Rubella immune.  RPR nonreactive.  Urine culture negative.  Hepatitis B surface antigen negative, HIV negative, GC and Chlamydia negative.  Hemoglobin AA, cystic fibrosis negative.  Quad screen negative.  Group B strep per patient was negative.  The patient's prenatal course other than the gestational diabetes mellitus has been uncomplicated, she did plan on with tubal sterilization, but she signed her papers less than 30 days ago.  She states that at approximately 5:30 p.m. today, she began leaking fluid, she came to the hospital, rupture of membranes was confirmed, but she decided that she wanted to try vaginal birth after cesarean.  She was admitted for vaginal birth after cesarean, but after starting Pitocin, she told the nurse that she was giving up the idea of a vaginal birth and wanted me to do a C-section. I came to visit the patient, asked her if she wanted to try an epidural and then go with a vaginal birth if possible.  She declined and wanted to proceed with the C-section.  PAST MEDICAL HISTORY:  No known allergies.  No latex allergy. Medical history:  Gestational diabetes in 2009 and again during this pregnancy.  SURGICAL HISTORY:  Breast reduction in 2000, C-section on February 04, 2008, for arrest of  descent.  The patient states that she was 10 cm dilated, but could not push the baby out.  FAMILY HISTORY:  Maternal grandmother with breast cancer, 2 maternal aunts, 2 uncles and 1 cousin with diabetes.  Maternal grandmother with high blood pressure.  OBSTETRIC HISTORY:  On February 04, 2008, a 7 pounds 4 ounce female infant, delivered by C-section for arrest of descent.  The patient denies tobacco, alcohol, and illicit substance abuse.  She is unemployed.  She is single.  PHYSICAL EXAMINATION:  VITAL SIGNS:  The patient is 5 feet 2 inches tall.  Her last prenatal visit, weight on Jun 24, 2011, was 251 pounds. Her blood pressure is 134/72, pulse 95, respirations 18, temperature 98.4. HEART:  Normal size and sounds.  No murmurs. LUNGS:  Clear to auscultation. The fundal height on Jun 24, 2011, was 38 cm.  Fetal heart tones are normal.  Cervix is 3 cm, fully effaced, the vertex at a -4 station.  ADMITTING IMPRESSION:  Intrauterine pregnancy at 37 weeks and 4 days, premature rupture of the membranes, prior C-section, declines vaginal birth after cesarean section.  The patient is prepared for repeat  C- section.  She understands that the tubal ligation will not be done because her papers have not been signed with the appropriate length of time.     Malachi Pro. Ambrose Mantle, M.D.     TFH/MEDQ  D:  07/03/2011  T:  07/03/2011  Job:  [id number]

## 2011-07-03 NOTE — Consult Note (Signed)
Neonatology Note:   Attendance at C-section:    I was asked to attend this repeat C/S at 37 4/7 weeks due to failed VBAC. The mother is a G2P1 O pos, GBS neg with diet-controlled GDM. ROM 8 hours prior to delivery, fluid clear. Infant vigorous with good spontaneous cry and tone. Needed only minimal bulb suctioning. Ap 9/9. Lungs clear to ausc in DR. To CN to care of Pediatrician.   Deatra James, MD

## 2011-07-03 NOTE — Addendum Note (Signed)
Addendum  created 07/03/11 1059 by Orlie Pollen, CRNA   Modules edited:Notes Section

## 2011-07-03 NOTE — Anesthesia Postprocedure Evaluation (Signed)
Anesthesia Post Note  Patient: Jamie Hays  Procedure(s) Performed: Procedure(s) (LRB): CESAREAN SECTION (N/A)  Anesthesia type: Spinal  Patient location: PACU  Post pain: Pain level controlled  Post assessment: Post-op Vital signs reviewed  Last Vitals:  Filed Vitals:   07/03/11 0300  BP: 121/82  Pulse: 110  Temp:   Resp: 27    Post vital signs: Reviewed  Level of consciousness: awake  Complications: No apparent anesthesia complications

## 2011-07-03 NOTE — H&P (Signed)
Jamie Hays is a 31 y.o. female presenting for PROM Maternal Medical History:  Reason for admission: Reason for admission: rupture of membranes.  Contractions: Onset was 1-2 hours ago.   Frequency: regular.   Perceived severity is strong.    Fetal activity: Perceived fetal activity is normal.   Last perceived fetal movement was within the past hour.    Prenatal Complications - Diabetes: gestational. Diabetes is managed by diet.      OB History    Grav Para Term Preterm Abortions TAB SAB Ect Mult Living   2 1 1       1      Past Medical History  Diagnosis Date  . Urinary tract infection   . Hypertension   . Headache    Past Surgical History  Procedure Date  . Cesarean section   . Breast reduction surgery 2000   Family History: family history is negative for Anesthesia problems, and Hypotension, and Malignant hyperthermia, and Pseudochol deficiency, . Social History:  reports that she has never smoked. She has never used smokeless tobacco. She reports that she drinks about .6 ounces of alcohol per week. She reports that she does not use illicit drugs.  ROS  Dilation: 2 Effacement (%): 70 Station: -2 Exam by:: LCarpenter,RN Blood pressure 134/72, pulse 95, temperature 98.4 F (36.9 C), temperature source Oral, resp. rate 18, weight 114.306 kg (252 lb), last menstrual period 10/01/2010. Exam Physical Exam  Prenatal labs: ABO, Rh: --/--/O POS (10/27 1852) Antibody: Negative (01/08 0000) Rubella: Immune (01/08 0000) RPR: Nonreactive (01/08 0000)  HBsAg: Negative (01/08 0000)  HIV: Non-reactive (01/08 0000)  ZOX:[id number]     Assessment/Plan:Repeat c section at pt request   Jamie Hays F 07/03/2011, 12:26 AM

## 2011-07-03 NOTE — Anesthesia Postprocedure Evaluation (Signed)
  Anesthesia Post-op Note  Patient: Jamie Hays  Procedure(s) Performed: Procedure(s) (LRB): CESAREAN SECTION (N/A)  Patient Location: Mother/Baby  Anesthesia Type: Spinal  Level of Consciousness: awake, alert  and oriented  Airway and Oxygen Therapy: Patient Spontanous Breathing  Post-op Pain: mild  Post-op Assessment: Patient's Cardiovascular Status Stable, Respiratory Function Stable, Patent Airway, No signs of Nausea or vomiting and Pain level controlled  Post-op Vital Signs: stable  Complications: No apparent anesthesia complications

## 2011-07-04 LAB — CBC
Hemoglobin: 10.1 g/dL — ABNORMAL LOW (ref 12.0–15.0)
MCH: 28.9 pg (ref 26.0–34.0)
MCHC: 33 g/dL (ref 30.0–36.0)
Platelets: 201 10*3/uL (ref 150–400)

## 2011-07-04 NOTE — Progress Notes (Signed)
Pt. C/o thinking she is getting a UTI, pt. Declines feeling pain, pressure, urgency, or problems urinating or emptying bladder.  This RN offered to call Dr. Rock Nephew. Plans to talk to Dr. In am and let RN know if symptoms change or worsen.  THis RN encouraged pt to increase po fluids.

## 2011-07-04 NOTE — Progress Notes (Signed)
Patient ID: Jamie Hays, female   DOB: July 10, 1980, 31 y.o.   MRN: [id number] #1 afebrile BP normal Tolerating a diet and has passed flatus HGB 12.5 to 10.1

## 2011-07-05 ENCOUNTER — Encounter (HOSPITAL_COMMUNITY): Payer: Self-pay | Admitting: Obstetrics and Gynecology

## 2011-07-05 NOTE — Progress Notes (Signed)
Patient ID: Jamie Hays, female   DOB: [birth year], 31 y.o.   MRN: [id number] #[id number] afebrile doing well Tolerating a diet and ambulating

## 2011-07-05 NOTE — Progress Notes (Signed)
UR chart review completed.  

## 2011-07-05 NOTE — Plan of Care (Signed)
Problem: Consults Goal: Postpartum Patient Education (See Patient Education module for education specifics.)  Outcome: Progressing Dressing off incision JP drain still in and charged.

## 2011-07-06 ENCOUNTER — Other Ambulatory Visit (HOSPITAL_COMMUNITY): Payer: Medicaid Other

## 2011-07-06 ENCOUNTER — Inpatient Hospital Stay (HOSPITAL_COMMUNITY): Payer: Medicaid Other

## 2011-07-06 LAB — URINALYSIS, ROUTINE W REFLEX MICROSCOPIC
Glucose, UA: NEGATIVE mg/dL
Ketones, ur: NEGATIVE mg/dL
Leukocytes, UA: NEGATIVE
Protein, ur: NEGATIVE mg/dL

## 2011-07-06 LAB — URINE MICROSCOPIC-ADD ON

## 2011-07-06 MED ORDER — OXYCODONE-ACETAMINOPHEN 5-325 MG PO TABS: 1.0000 | ORAL_TABLET | Freq: Four times a day (QID) | ORAL | Status: AC | PRN

## 2011-07-06 MED ORDER — IBUPROFEN 600 MG PO TABS: 600.0000 mg | ORAL_TABLET | Freq: Four times a day (QID) | ORAL | Status: AC

## 2011-07-06 MED ORDER — IOHEXOL 300 MG/ML  SOLN
100.0000 mL | Freq: Once | INTRAMUSCULAR | Status: AC | PRN
  Administered 2011-07-06: 100 mL via INTRAVENOUS

## 2011-07-06 NOTE — Discharge Instructions (Signed)
booklet °

## 2011-07-06 NOTE — Progress Notes (Signed)
Patient ID: Jamie Hays, female   DOB: March 11, 1980, 31 y.o.   MRN: [id number] CT scan and urinalysis negative Will D/C

## 2011-07-06 NOTE — Discharge Summary (Signed)
[name]               ACCOUNT NO.:  [id number]  MEDICAL RECORD NO.:  [id number]  LOCATION:  9123                          FACILITY:  [facility]  PHYSICIAN:  Malachi Pro. Ambrose Mantle, M.D. DATE OF BIRTH:  05/02/1980  DATE OF ADMISSION:  07/02/2011 DATE OF DISCHARGE:  07/06/2011                              DISCHARGE SUMMARY   This is a 31 year old black female, para 1-0-1-1, gravida 2, [facility] July 20, 2011, by an ultrasound at 6 weeks and 4 days.  The patient failed 3- hour GTT and has kept track of her sugars with diet alone.  Almost all the blood sugars were normal.  Blood group and type O positive. Negative antibody.  Pap smear normal.  Rubella immune.  RPR nonreactive. Urine culture negative.  Hepatitis B surface antigen negative.  HIV negative.  GC and Chlamydia negative.  Hemoglobin AA.  Cystic fibrosis negative.  Quad screen negative.  Group B strep per the patient was negative.  The patient was admitted and stated that she wanted to try vaginal birth, but after starting the Pitocin and having some contractions, she wanted me to do a C-section.  At canned visit with the patient, I asked her if she wanted to try an epidural and go with a vaginal birth if possible, she declined and wanted to proceed with a C- section.  The patient had signed papers for tubal sterilization, but they had not been done long enough to allow for the tubal to be done. The patient underwent the C-section by Dr. Ambrose Mantle and did well, and baby was delivered without incident and the baby did well.  Postoperatively, the patient has had no problems.  Her laboratory data showed initial hemoglobin of 13.4, hematocrit 39.5, white count 12,500, platelet count 279,000.  Hemoglobin on admission was 12.5, hematocrit 37.6, white count 9800, platelet count 215,000. Followup hemoglobin the day after surgery was 10.1, hematocrit 30.6. The patient's staples had been left in.  On the day of discharge, the patient stated she  had some substernal chest pain with taking deep breaths, and also had some urinary symptoms.  Cath urine was negative and a CT scan of the chest was negative.  The patient is discharged now.  FINAL DIAGNOSES:  Intrauterine pregnancy at 37 weeks and 4 days, premature rupture of the membranes, and prior C-section.  The patient declined vaginal birth.  OPERATION:  Repeat low-transverse cervical C-section.  FINAL CONDITION:  Improved.  INSTRUCTIONS:  Our regular discharge instruction booklets, Percocet 5/325, 30 tablets, 1 every 6 hours as needed for pain and Motrin 600 mg, 30 tablets, 1 every 6 hours as needed for pain with 1 refill.  She is to return to the office in 3 days to have her staples removed.     Malachi Pro. Ambrose Mantle, M.D.    TFH/MEDQ  D:  07/06/2011  T:  07/06/2011  Job:  [id number]

## 2011-07-06 NOTE — Procedures (Signed)
Pt to Radiology for CT Scan with Contrast

## 2011-07-06 NOTE — Progress Notes (Signed)
Patient ID: Jamie Hays, female   DOB: 1980/12/15, 31 y.o.   MRN: [id number] #[id number] afebrile BP normal States she has some mid sternal pain when she breathes deeply.Heart normal sounds. Lungs clear O2 sat 95 Will evaluate with imaging.

## 2011-07-07 LAB — URINE CULTURE: Colony Count: NO GROWTH

## 2011-07-13 ENCOUNTER — Encounter (HOSPITAL_COMMUNITY): Admission: RE | Payer: Self-pay | Source: Ambulatory Visit

## 2011-07-13 ENCOUNTER — Inpatient Hospital Stay (HOSPITAL_COMMUNITY)
Admission: RE | Admit: 2011-07-13 | Payer: Medicaid Other | Source: Ambulatory Visit | Admitting: Obstetrics and Gynecology

## 2011-07-13 SURGERY — Surgical Case
Anesthesia: Choice

## 2012-01-25 ENCOUNTER — Encounter (HOSPITAL_COMMUNITY): Payer: Self-pay | Admitting: *Deleted

## 2012-01-25 ENCOUNTER — Emergency Department (HOSPITAL_COMMUNITY)
Admission: EM | Admit: 2012-01-25 | Discharge: 2012-01-26 | Disposition: A | Discharge status: Home / Self Care | Payer: Medicaid Other | Attending: Emergency Medicine | Admitting: Emergency Medicine

## 2012-01-25 DIAGNOSIS — G5603 Carpal tunnel syndrome, bilateral upper limbs: Secondary | ICD-10-CM

## 2012-01-25 DIAGNOSIS — I10 Essential (primary) hypertension: Secondary | ICD-10-CM | POA: Insufficient documentation

## 2012-01-25 DIAGNOSIS — Z87440 Personal history of urinary (tract) infections: Secondary | ICD-10-CM | POA: Insufficient documentation

## 2012-01-25 DIAGNOSIS — G5600 Carpal tunnel syndrome, unspecified upper limb: Secondary | ICD-10-CM | POA: Insufficient documentation

## 2012-01-25 NOTE — ED Notes (Signed)
Pt states she was in car waiting to see Christmas lights and that her left arm became numb,  "feels little tingle in left chest area but denies pain,  Nausea or vomiting

## 2012-01-26 ENCOUNTER — Emergency Department (HOSPITAL_COMMUNITY): Payer: Medicaid Other

## 2012-01-26 MED ORDER — METHOCARBAMOL 500 MG PO TABS: 1000.0000 mg | ORAL_TABLET | Freq: Four times a day (QID) | ORAL | Status: DC

## 2012-01-26 NOTE — ED Notes (Signed)
Patient was in car and began experiencing numbness & tingling in left arm, denies pain but does report the tingling feeling radiates to her chest. No cardiac history. VSS.

## 2012-01-26 NOTE — ED Provider Notes (Addendum)
History     CSN: [id number]  Arrival date & time 01/25/12  2318   First MD Initiated Contact with Patient 01/25/12 2359      Chief Complaint  Patient presents with  . Extremity Weakness    (Consider location/radiation/quality/duration/timing/severity/associated sxs/prior treatment) HPI  Patient states today she was driving her vehicle going through [place] to look at the Christmas lights and she started having a tingly feeling in her left arm about 6 PM. She denies it being weak or numb. She states it feels tingly and cold and indicates the upper arm and the forearm area however the sensation goes into her fingers. She states that her hands hurt at night and for 5 years ago she wore a brace on her right wrist for carpal tunnel which helped her symptoms. However she states her symptoms have started returning about 6 months ago. 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 She states the numbness in her arm feels better when she covers it with a blanket. She denies headache, blurred or double vision, nausea or vomiting. She denies any recent change in activity or any activity that makes it feel worse.  PCP none  Past Medical History  Diagnosis Date  . Urinary tract infection   . Hypertension   . Headache     Past Surgical History  Procedure Date  . Cesarean section   . Breast reduction surgery 2000  . Cesarean section 07/03/2011    Procedure: CESAREAN SECTION;  Surgeon: Bing Plume, MD;  Location: [facility];  Service: Gynecology;  Laterality: N/A;  Repeat Cesarean Section Delivery Boy @ 0102, Apgars 9/9    Family History  Problem Relation Age of Onset  . Anesthesia problems Neg Hx   . Hypotension Neg Hx   . Malignant hyperthermia Neg Hx   . Pseudochol deficiency Neg Hx    no family history of stroke  History  Substance Use  Topics  . Smoking status: Never Smoker   . Smokeless tobacco: Never Used  . Alcohol Use: 0.6 oz/week    1 Shots of liquor per week   full-time student  OB History    Grav Para Term Preterm Abortions TAB SAB Ect Mult Living   2 2 2       2       Review of Systems  All other systems reviewed and are negative.    Allergies  Review of patient's allergies indicates no known allergies.  Home Medications  Patient denies any hormones or birth control   BP 126/90  Pulse 87  Temp 98.4 F (36.9 C) (Oral)  Resp 20  Ht 5\' 2"  (1.575 m)  SpO2 100%  LMP 12/05/2011  Vital signs normal    Physical Exam  Nursing note and vitals reviewed. Constitutional: She is oriented to person, place, and time. She appears well-developed and well-nourished.  Non-toxic appearance. She does not appear ill. No distress.  HENT:  Head: Normocephalic and atraumatic.  Right Ear: External ear normal.  Left Ear: External ear normal.  Nose: Nose normal. No mucosal edema or rhinorrhea.  Mouth/Throat: Oropharynx is clear and moist and mucous membranes are normal. No dental abscesses or uvula swelling.  Eyes: Conjunctivae normal and EOM are normal. Pupils are equal, round, and reactive to light.  Neck: Normal range of motion and full  passive range of motion without pain. Neck supple.       Nontender trapezius and paraspinous muscles  Cardiovascular: Normal rate, regular rhythm and normal heart sounds.  Exam reveals no gallop and no friction rub.   No murmur heard. Pulmonary/Chest: Effort normal and breath sounds normal. No respiratory distress. She has no wheezes. She has no rhonchi. She has no rales. She exhibits no tenderness and no crepitus.  Abdominal: Soft. Normal appearance and bowel sounds are normal. She exhibits no distension. There is no tenderness. There is no rebound and no guarding.  Musculoskeletal: Normal range of motion. She exhibits no edema and no tenderness.       Moves all extremities well.  Patient has normal temperature sensation on both extremities. Patient states her left arm feels cold however it does not feel colder to touch to me. She has good radial pulses bilaterally. Her skin is normal without abnormal skin color either pale or red. She has positive for Phalen's sign and Tinel's sign bilaterally  Neurological: She is alert and oriented to person, place, and time. She has normal strength. No cranial nerve deficit.  Skin: Skin is warm, dry and intact. No rash noted. No erythema. No pallor.  Psychiatric: She has a normal mood and affect. Her speech is normal and behavior is normal. Her mood appears not anxious.    ED Course  Procedures (including critical care time) Patient placed in a Velcro cockup wrist splint on the left. She states she still has one for her right wrist at home.   Dg Cervical Spine Complete  01/26/2012  *RADIOLOGY REPORT*  Clinical Data: Right arm pain and tingling, no injury  CERVICAL SPINE - COMPLETE 4+ VIEW  Comparison: None  Findings: Slight reversal of cervical lordosis question muscle spasm. Mild osseous demineralization. Prevertebral soft tissues normal thickness. Bony foramina patent. Vertebral body and disc space heights maintained. No acute fracture, subluxation or bone destruction. C1-C2 alignment normal.  IMPRESSION: Question muscle spasm; otherwise negative exam.   Original Report Authenticated By: Ulyses Southward, M.D.     Date: 01/26/2012  Rate: 87  Rhythm: normal sinus rhythm  QRS Axis: normal  Intervals: normal  ST/T Wave abnormalities: normal  Conduction Disutrbances:none  Narrative Interpretation: LAE  Old EKG Reviewed: unchanged from 04/19/2008     1. Carpal tunnel syndrome on both sides     New Prescriptions   METHOCARBAMOL (ROBAXIN) 500 MG TABLET    Take 2 tablets (1,000 mg total) by mouth 4 (four) times daily.    Plan discharge  Devoria Albe, MD, FACEP   MDM          Ward Givens, MD 01/26/12 4098  Ward Givens,  MD 01/26/12 1191

## 2012-01-26 NOTE — ED Notes (Signed)
Discharge instructions reviewed. Rx given x1.  

## 2012-03-06 ENCOUNTER — Encounter: Payer: Self-pay | Admitting: Obstetrics & Gynecology

## 2012-03-13 ENCOUNTER — Encounter: Payer: Self-pay | Admitting: *Deleted

## 2012-03-23 ENCOUNTER — Encounter: Payer: Medicaid Other | Admitting: Obstetrics & Gynecology

## 2012-03-25 ENCOUNTER — Inpatient Hospital Stay (HOSPITAL_COMMUNITY)
Admission: AD | Admit: 2012-03-25 | Discharge: 2012-03-25 | Disposition: A | Discharge status: Home / Self Care | Payer: Medicaid Other | Source: Ambulatory Visit | Attending: Obstetrics & Gynecology | Admitting: Obstetrics & Gynecology

## 2012-03-25 ENCOUNTER — Encounter (HOSPITAL_COMMUNITY): Payer: Self-pay | Admitting: *Deleted

## 2012-03-25 DIAGNOSIS — A088 Other specified intestinal infections: Secondary | ICD-10-CM | POA: Insufficient documentation

## 2012-03-25 DIAGNOSIS — O99891 Other specified diseases and conditions complicating pregnancy: Secondary | ICD-10-CM | POA: Insufficient documentation

## 2012-03-25 DIAGNOSIS — O99340 Other mental disorders complicating pregnancy, unspecified trimester: Secondary | ICD-10-CM | POA: Insufficient documentation

## 2012-03-25 DIAGNOSIS — A084 Viral intestinal infection, unspecified: Secondary | ICD-10-CM

## 2012-03-25 DIAGNOSIS — F411 Generalized anxiety disorder: Secondary | ICD-10-CM | POA: Insufficient documentation

## 2012-03-25 LAB — URINALYSIS, ROUTINE W REFLEX MICROSCOPIC
Bilirubin Urine: NEGATIVE
Ketones, ur: NEGATIVE mg/dL
Nitrite: NEGATIVE
Protein, ur: 30 mg/dL — AB
Specific Gravity, Urine: 1.025 (ref 1.005–1.030)
Urobilinogen, UA: 0.2 mg/dL (ref 0.0–1.0)

## 2012-03-25 LAB — URINE MICROSCOPIC-ADD ON

## 2012-03-25 MED ORDER — HYDROXYZINE PAMOATE 25 MG PO CAPS: 25.0000 mg | ORAL_CAPSULE | Freq: Three times a day (TID) | ORAL | Status: DC | PRN

## 2012-03-25 NOTE — MAU Provider Note (Signed)
Attestation of Attending Supervision of Advanced Practitioner: Evaluation and management procedures were performed by the PA/NP/CNM/OB Fellow under my supervision/collaboration. Chart reviewed and agree with management and plan.  Steffon Gladu V 03/25/2012 6:11 AM

## 2012-03-25 NOTE — MAU Provider Note (Signed)
Chief Complaint: No chief complaint on file.   First Provider Initiated Contact with Patient 03/25/12 0544     SUBJECTIVE HPI: Jamie Hays is a 32 y.o. G2P2002 at who presents to maternity admissions reporting feelings of anxiety, fast heart rate, frequent urination and bowel movements since Depo shot yesterday around noon.  She has had 2-3 administrations of Depo prior to this one today without reaction.  She reports the only other medication she has taken in last 48 hours is extra strength Tylenol for h/a, which also started after Depo shot but resolved after Tylenol. She has never taken illegal drugs or medications not prescribed to her.  She has an appointment next week with Dr Marice Potter to discuss tubal ligation. She denies abdominal pain, leg pain,shortness of breath, current vaginal bleeding, vaginal itching/burning, urinary symptoms, h/a, dizziness, n/v, or fever/chills.     Past Medical History  Diagnosis Date  . Urinary tract infection   . Hypertension   . Headache    Past Surgical History  Procedure Laterality Date  . Cesarean section    . Breast reduction surgery  2000  . Cesarean section  07/03/2011    Procedure: CESAREAN SECTION;  Surgeon: Bing Plume, MD;  Location: [facility];  Service: Gynecology;  Laterality: N/A;  Repeat Cesarean Section Delivery Boy @ 0102, Apgars 9/9   History   Social History  . Marital Status: Single    Spouse Name: N/A    Number of Children: N/A  . Years of Education: N/A   Occupational History  . Not on file.   Social History Main Topics  . Smoking status: Never Smoker   . Smokeless tobacco: Never Used  . Alcohol Use: 0.6 oz/week    1 Shots of liquor per week  . Drug Use: No  . Sexually Active: Yes   Other Topics Concern  . Not on file   Social History Narrative  . No narrative on file   No current facility-administered medications on file prior to encounter.   No current outpatient prescriptions on file prior to encounter.   No  Known Allergies  ROS: Pertinent items in HPI  OBJECTIVE Blood pressure 139/96, pulse 75, temperature 98 F (36.7 C), temperature source Oral, resp. rate 20, height 5\' 2"  (1.575 m), weight 114.125 kg (251 lb 9.6 oz), last menstrual period 02/04/2011, SpO2 99.00%, unknown if currently breastfeeding.   GENERAL: Well-developed, well-nourished female in no acute distress.  HEENT: Normocephalic HEART: normal rate and rhythm, normal heart sounds RESP: normal effort, clear and equal bilaterally in all lobes ABDOMEN: Soft, non-tender EXTREMITIES: Nontender, no edema NEURO: Alert and oriented SPECULUM EXAM: Deferred  LAB RESULTS Results for orders placed during the hospital encounter of 03/25/12 (from the past 24 hour(s))  URINALYSIS, ROUTINE W REFLEX MICROSCOPIC     Status: Abnormal   Collection Time    03/25/12  4:35 AM      Result Value Range   Color, Urine YELLOW  YELLOW   APPearance CLEAR  CLEAR   Specific Gravity, Urine 1.025  1.005 - 1.030   pH 6.0  5.0 - 8.0   Glucose, UA NEGATIVE  NEGATIVE mg/dL   Hgb urine dipstick SMALL (*) NEGATIVE   Bilirubin Urine NEGATIVE  NEGATIVE   Ketones, ur NEGATIVE  NEGATIVE mg/dL   Protein, ur 30 (*) NEGATIVE mg/dL   Urobilinogen, UA 0.2  0.0 - 1.0 mg/dL   Nitrite NEGATIVE  NEGATIVE   Leukocytes, UA NEGATIVE  NEGATIVE  URINE MICROSCOPIC-ADD ON  Status: Abnormal   Collection Time    03/25/12  4:35 AM      Result Value Range   Squamous Epithelial / LPF FEW (*) RARE   WBC, UA 0-2  <3 WBC/hpf   RBC / HPF 3-6  <3 RBC/hpf   Bacteria, UA FEW (*) RARE  POCT PREGNANCY, URINE     Status: None   Collection Time    03/25/12  5:08 AM      Result Value Range   Preg Test, Ur NEGATIVE  NEGATIVE    ASSESSMENT 1. Anxiety reaction   2. Viral gastroenteritis     PLAN Discharge home Vistaril 25 mg TID PRN sent to pt pharmacy.  Pt driving so none given in MAU.   Drink plenty of water Return to MAU if symptoms persist or worsen Keep scheduled  appointment at [facility] to discuss future contraception    Medication List    STOP taking these medications       methocarbamol 500 MG tablet  Commonly known as:  ROBAXIN      TAKE these medications       acetaminophen 500 MG tablet  Commonly known as:  TYLENOL  Take 1,000 mg by mouth every 6 (six) hours as needed for pain.     hydrOXYzine 25 MG capsule  Commonly known as:  VISTARIL  Take 1 capsule (25 mg total) by mouth 3 (three) times daily as needed for itching.           Follow-up Information   Follow up with [facility]. (Keep scheduled appointment.  Return to MAU as needed.)    Contact information:   [address] [place] [address] [phone]      Sharen Counter Certified Nurse-Midwife 03/25/2012  5:46 AM

## 2012-03-25 NOTE — MAU Note (Signed)
On Friday went to [facility] and got Depo. When I try to go to sleep, I cannot sleep. I would almost sleep and I would jump up like I can't breathe. Feeling kind of nervous. Have had 3 soft stools since 0200 but no diarrhea.

## 2012-03-25 NOTE — Progress Notes (Signed)
CNM notified of pt's admission and status. Aware of elevated B/Ps and anxious feelings tonight. Aware pt may relate it to depo shot but has never had this before with depo

## 2012-03-25 NOTE — Progress Notes (Signed)
Sharen Counter CNM in and discussed d/c plan. Written and verbal d/c instructions given and understanding voiced

## 2012-03-31 ENCOUNTER — Ambulatory Visit (INDEPENDENT_AMBULATORY_CARE_PROVIDER_SITE_OTHER): Payer: Medicaid Other | Admitting: Obstetrics & Gynecology

## 2012-03-31 ENCOUNTER — Encounter: Payer: Self-pay | Admitting: Obstetrics & Gynecology

## 2012-03-31 VITALS — BP 139/91 | HR 80 | Temp 98.5°F | Ht 62.0 in | Wt 248.2 lb

## 2012-03-31 DIAGNOSIS — Z302 Encounter for sterilization: Secondary | ICD-10-CM

## 2012-03-31 NOTE — Progress Notes (Signed)
History:  32 y.o. [id number] here today for BTS evaluation.  History of two cesarean sections. No other GYN concerns.  The following portions of the patient's history were reviewed and updated as appropriate: allergies, current medications, past family history, past medical history, past social history, past surgical history and problem list.  Review of Systems:  A comprehensive review of systems was negative.  Objective:  Physical Exam BP 139/91  Pulse 80  Temp(Src) 98.5 F (36.9 C) (Oral)  Ht 5\' 2"  (1.575 m)  Wt 248 lb 3.2 oz (112.583 kg)  BMI 45.38 kg/m2  LMP 02/04/2011 Gen: NAD Abd: Soft, obese, nontender and nondistended Pelvic: Deferred  Assessment & Plan:  Patient desires permanent sterilization.  Other reversible forms of contraception were discussed with patient; she declines all other modalities. Offered laparoscopic or hysteroscopic sterilization, discussed procedure details, risks and benefits of both modalities; she elects for laparoscopic bilateral tubal sterilization using Filshie clips and declines bilateral salpingectomy.   Risks of procedure discussed with patient including but not limited to: risk of regret, permanence of method, bleeding, infection, injury to surrounding organs and need for additional procedures including laparotomy.  Failure risk of 0.5-1% with increased risk of ectopic gestation if pregnancy occurs was also discussed with patient.  Patient verbalized understanding of these risks and wants to proceed with sterilization; she does want this to be scheduled in May.  Medicaid papers signed today.  She was told that she will be contacted by our surgical scheduler regarding the time and date of her surgery. She was told she will be called for a preoperative appointment in clinic with me prior to her scheduled surgery date to re-review the surgery details.  Printed patient education handouts about the procedure were given to the patient to review at home.

## 2012-03-31 NOTE — Patient Instructions (Signed)
Laparoscopic Tubal Ligation Laparoscopic tubal ligation is a procedure that closes the fallopian tubes at a time other than right after childbirth. By closing the fallopian tubes, the eggs that are released from the ovaries cannot enter the uterus and sperm cannot reach the egg. Tubal ligation is also known as getting your "tubes tied." Tubal ligation is done so you will not be able to get pregnant or have a baby.  Although this procedure may be reversed, it should be considered permanent and irreversible. If you want to have future pregnancies, you should not have this procedure.  LET YOUR CAREGIVER KNOW ABOUT:  Allergies to food or medicine.  Medicines taken, including vitamins, herbs, eyedrops, over-the-counter medicines, and creams.  Use of steroids (by mouth or creams).  Previous problems with numbing medicines.  History of bleeding problems or blood clots.  Any recent colds or infections.  Previous surgery.  Other health problems, including diabetes and kidney problems.  Possibility of pregnancy, if this applies.  Any past pregnancies. RISKS AND COMPLICATIONS   Infection.  Bleeding.  Injury to surrounding organs.  Anesthetic side effects.  Failure of the procedure.  Ectopic pregnancy.  Future regret about having the procedure done. BEFORE THE PROCEDURE  Do not take aspirin or blood thinners a week before the procedure or as directed. This can cause bleeding.  Do not eat or drink anything 6 to 8 hours before the procedure. PROCEDURE   You may be given a medicine to help you relax (sedative) before the procedure. You will be given a medicine to make you sleep (general anesthetic) during the procedure.  A tube will be put down your throat to help your breath while under general anesthesia.  Two small cuts (incisions) are made in the lower abdominal area and near the belly button.  Your abdominal area will be inflated with a safe gas (carbon dioxide). This helps  give the surgeon room to operate, visualize, and helps the surgeon avoid other organs.  A thin, lighted tube (laparoscope) with a camera attached is inserted into your abdomen through one of the incisions near the belly button. Other small instruments are also inserted through the other abdominal incision.  The fallopian tubes are located and are either blocked with a ring, clip, or are burned (cauterized).  After the fallopian tubes are blocked, the gas is released from the abdomen.  The incisions will be closed with stitches (sutures), and a bandage may be placed over the incisions. AFTER THE PROCEDURE   You will rest in a recovery room for 1 4 hours until you are stable and doing well.  You will also have some mild abdominal discomfort for 3 7 days. You will be given pain medicine to ease any discomfort.  As long as there are no problems, you may be allowed to go home. Someone will need to drive you home and be with you for at least 24 hours once home.  You may have some mild discomfort in the throat. This is from the tube placed in your throat while you were sleeping.  You may experience discomfort in the shoulder area from some trapped air between the liver and diaphragm. This sensation is normal and will slowly go away on its own. Document Released: 04/26/2000 Document Revised: 07/20/2011 Document Reviewed: 05/01/2011 [employer] Patient Information 2013 [employer].  

## 2012-04-03 ENCOUNTER — Encounter: Payer: Self-pay | Admitting: *Deleted

## 2012-04-04 ENCOUNTER — Encounter: Payer: Self-pay | Admitting: *Deleted

## 2012-05-01 ENCOUNTER — Emergency Department (HOSPITAL_COMMUNITY)
Admission: EM | Admit: 2012-05-01 | Discharge: 2012-05-01 | Disposition: A | Discharge status: Home / Self Care | Payer: Self-pay | Attending: Emergency Medicine | Admitting: Emergency Medicine

## 2012-05-01 ENCOUNTER — Encounter (HOSPITAL_COMMUNITY): Payer: Self-pay | Admitting: Emergency Medicine

## 2012-05-01 DIAGNOSIS — I10 Essential (primary) hypertension: Secondary | ICD-10-CM | POA: Insufficient documentation

## 2012-05-01 DIAGNOSIS — N390 Urinary tract infection, site not specified: Secondary | ICD-10-CM | POA: Insufficient documentation

## 2012-05-01 DIAGNOSIS — R350 Frequency of micturition: Secondary | ICD-10-CM | POA: Insufficient documentation

## 2012-05-01 DIAGNOSIS — R3589 Other polyuria: Secondary | ICD-10-CM | POA: Insufficient documentation

## 2012-05-01 DIAGNOSIS — R358 Other polyuria: Secondary | ICD-10-CM | POA: Insufficient documentation

## 2012-05-01 LAB — URINALYSIS, ROUTINE W REFLEX MICROSCOPIC
Nitrite: NEGATIVE
Specific Gravity, Urine: 1.026 (ref 1.005–1.030)
Urobilinogen, UA: 1 mg/dL (ref 0.0–1.0)

## 2012-05-01 LAB — URINE MICROSCOPIC-ADD ON

## 2012-05-01 LAB — POCT PREGNANCY, URINE: Preg Test, Ur: NEGATIVE

## 2012-05-01 MED ORDER — NITROFURANTOIN MONOHYD MACRO 100 MG PO CAPS: 100.0000 mg | ORAL_CAPSULE | Freq: Two times a day (BID) | ORAL | Status: DC

## 2012-05-01 NOTE — ED Provider Notes (Signed)
History     CSN: [id number]  Arrival date & time 05/01/12  0102   First MD Initiated Contact with Patient 05/01/12 [phone]      Chief Complaint  Patient presents with  . Abdominal Pain    pressure lower abd, symptoms of early UTI pt states  . Polyuria    having to go more frequently     HPI Pt states that she has some discomfort lower abd area since Thursday. Pt states that she has had UTI in past and this feels like early stages of one. Pt states that she does notice small amounts of blood when she wipes after urinating but not for sure if there is any blood in her urine. Pt also c/o having to urinate more frequently.       Past Medical History  Diagnosis Date  . Urinary tract infection   . Hypertension   . Headache     Past Surgical History  Procedure Laterality Date  . Cesarean section    . Breast reduction surgery  2000  . Cesarean section  07/03/2011    Procedure: CESAREAN SECTION;  Surgeon: Bing Plume, MD;  Location: [facility];  Service: Gynecology;  Laterality: N/A;  Repeat Cesarean Section Delivery Boy @ 0102, Apgars 9/9    Family History  Problem Relation Age of Onset  . Anesthesia problems Neg Hx   . Hypotension Neg Hx   . Malignant hyperthermia Neg Hx   . Pseudochol deficiency Neg Hx   . Diabetes Maternal Aunt   . Diabetes Maternal Uncle   . Cancer Maternal Grandmother     History  Substance Use Topics  . Smoking status: Never Smoker   . Smokeless tobacco: Never Used  . Alcohol Use: 0.6 oz/week    1 Shots of liquor per week    OB History   Grav Para Term Preterm Abortions TAB SAB Ect Mult Living   2 2 2       2       Review of Systems  Constitutional: Negative for fever.  Gastrointestinal: Negative for abdominal distention.  All other systems reviewed and are negative.    Allergies  Review of patient's allergies indicates no known allergies.  Home Medications   Current Outpatient Rx  Name  Route  Sig  Dispense  Refill  .  nitrofurantoin, macrocrystal-monohydrate, (MACROBID) 100 MG capsule   Oral   Take 1 capsule (100 mg total) by mouth 2 (two) times daily.   10 capsule   0     BP 143/94  Pulse 88  Temp(Src) 98.6 F (37 C) (Oral)  Resp 18  SpO2 97%  Physical Exam  Nursing note and vitals reviewed. Constitutional: She is oriented to person, place, and time. She appears well-developed and well-nourished. No distress.  HENT:  Head: Normocephalic and atraumatic.  Eyes: Pupils are equal, round, and reactive to light.  Neck: Normal range of motion.  Cardiovascular: Normal rate and intact distal pulses.   Pulmonary/Chest: No respiratory distress.  Abdominal: Normal appearance. She exhibits no distension. There is no tenderness.  Musculoskeletal: Normal range of motion.  Neurological: She is alert and oriented to person, place, and time. No cranial nerve deficit.  Skin: Skin is warm and dry. No rash noted.  Psychiatric: She has a normal mood and affect. Her behavior is normal.    ED Course  Procedures (including critical care time)  Labs Reviewed  URINALYSIS, ROUTINE W REFLEX MICROSCOPIC - Abnormal; Notable for the following:  APPearance CLOUDY (*)    Hgb urine dipstick LARGE (*)    Protein, ur 30 (*)    Leukocytes, UA SMALL (*)    All other components within normal limits  URINE MICROSCOPIC-ADD ON - Abnormal; Notable for the following:    Squamous Epithelial / LPF FEW (*)    Bacteria, UA MANY (*)    All other components within normal limits  URINE CULTURE  POCT PREGNANCY, URINE   No results found.   1. UTI (lower urinary tract infection)       MDM         Nelia Shi, MD 05/01/12 [phone]

## 2012-05-01 NOTE — ED Notes (Signed)
Pt states that she has some discomfort lower abd area since Thursday. Pt states that she has had UTI in past and this feels like early stages of one.  Pt states that she does notice small amounts of blood when she wipes after urinating but not for sure if there is any blood in her urine. Pt also c/o having to urinate more frequently.

## 2012-05-01 NOTE — ED Notes (Signed)
Pt states that she has had pressure to lower abd since thurs and thinks that she has an UTI. Has had once before and these are the same sx.

## 2012-05-03 LAB — URINE CULTURE

## 2012-05-04 ENCOUNTER — Telehealth (HOSPITAL_COMMUNITY): Payer: Self-pay | Admitting: Emergency Medicine

## 2012-05-04 NOTE — ED Notes (Signed)
Chart printed to sent to edp

## 2012-05-07 ENCOUNTER — Telehealth (HOSPITAL_COMMUNITY): Payer: Self-pay | Admitting: Emergency Medicine

## 2012-05-07 NOTE — ED Notes (Signed)
Chart returned from EDP office. Per [facility] PA-C, give Ciprofloxacin 250 mg PO BID x 3 days.

## 2012-05-18 ENCOUNTER — Inpatient Hospital Stay (HOSPITAL_COMMUNITY)
Admission: AD | Admit: 2012-05-18 | Discharge: 2012-05-18 | Disposition: A | Discharge status: Home / Self Care | Payer: Medicaid Other | Source: Ambulatory Visit | Attending: Obstetrics & Gynecology | Admitting: Obstetrics & Gynecology

## 2012-05-18 ENCOUNTER — Encounter (HOSPITAL_COMMUNITY): Payer: Self-pay | Admitting: *Deleted

## 2012-05-18 DIAGNOSIS — N920 Excessive and frequent menstruation with regular cycle: Secondary | ICD-10-CM

## 2012-05-18 DIAGNOSIS — N926 Irregular menstruation, unspecified: Secondary | ICD-10-CM | POA: Insufficient documentation

## 2012-05-18 DIAGNOSIS — N921 Excessive and frequent menstruation with irregular cycle: Secondary | ICD-10-CM

## 2012-05-18 LAB — URINALYSIS, ROUTINE W REFLEX MICROSCOPIC
Bilirubin Urine: NEGATIVE
Ketones, ur: 15 mg/dL — AB
Leukocytes, UA: NEGATIVE
Nitrite: NEGATIVE
Protein, ur: 30 mg/dL — AB

## 2012-05-18 LAB — URINE MICROSCOPIC-ADD ON

## 2012-05-18 LAB — CBC
MCH: 29.7 pg (ref 26.0–34.0)
MCHC: 34.8 g/dL (ref 30.0–36.0)
RDW: 14.1 % (ref 11.5–15.5)

## 2012-05-18 LAB — POCT PREGNANCY, URINE: Preg Test, Ur: NEGATIVE

## 2012-05-18 MED ORDER — MEGESTROL ACETATE 20 MG PO TABS: 20.0000 mg | ORAL_TABLET | Freq: Every day | ORAL | Status: DC

## 2012-05-18 NOTE — MAU Note (Signed)
Pt reports she has been bleeding for 3 weeks today, last Depo was in February. Over the last 3 days has changed her pad q 6-8 hours. intermitten cramping.

## 2012-05-18 NOTE — MAU Note (Signed)
Provider at bedside. Plan of care discussed. Speculum and pelvic exam done at this time. Pt. Tolerated procedure. well.

## 2012-05-18 NOTE — MAU Provider Note (Signed)
Chief Complaint:  Vaginal Bleeding  First Provider Initiated Contact with Patient 05/18/12 2302     HPI: Jamie Hays is a 32 y.o. [id number] who presents to maternity admissions reporting vaginal bleeding x 3 weeks. Received Dep-Provera in February for contrception. No bleeding from that time until three weeks ago. Usually has regular, monthly cycles. Changes pad 3 x day, not saturated. Also reports mild cramping.  .  Past Medical History: Past Medical History  Diagnosis Date  . Urinary tract infection   . Hypertension   . Headache     Past obstetric history: OB History   Grav Para Term Preterm Abortions TAB SAB Ect Mult Living   2 2 2       2      # Outc Date GA Lbr Len/2nd Wgt Sex Del Anes PTL Lv   1 TRM 1/10 [redacted]w[redacted]d   F LTCS Spinal No Yes   Comments: FTP   2 TRM 6/13 [redacted]w[redacted]d 00:00  M LTCS Spinal  Yes      Past Surgical History: Past Surgical History  Procedure Laterality Date  . Cesarean section    . Breast reduction surgery  2000  . Cesarean section  07/03/2011    Procedure: CESAREAN SECTION;  Surgeon: Bing Plume, MD;  Location: [facility];  Service: Gynecology;  Laterality: N/A;  Repeat Cesarean Section Delivery Boy @ 0102, Apgars 9/9    Family History: Family History  Problem Relation Age of Onset  . Anesthesia problems Neg Hx   . Hypotension Neg Hx   . Malignant hyperthermia Neg Hx   . Pseudochol deficiency Neg Hx   . Diabetes Maternal Aunt   . Diabetes Maternal Uncle   . Cancer Maternal Grandmother     Social History: History  Substance Use Topics  . Smoking status: Never Smoker   . Smokeless tobacco: Never Used  . Alcohol Use: No    Allergies: No Known Allergies  Meds:  Prescriptions prior to admission  Medication Sig Dispense Refill  . medroxyPROGESTERone (DEPO-PROVERA) 150 MG/ML injection Inject 150 mg into the muscle every 3 (three) months.        ROS: Pos for VB. Neg for fever, chills, vaginal discharge, dizziness, other heavy  bleeding.  Physical Exam  Blood pressure 131/82, pulse 88, temperature 98.7 F (37.1 C), temperature source Oral, resp. rate 20, height 5\' 2"  (1.575 m), weight 111.585 kg (246 lb), last menstrual period 04/27/2012, SpO2 96.00%. GENERAL: Well-developed, well-nourished female in no acute distress.  HEENT: normocephalic HEART: normal rate RESP: normal effort ABDOMEN: Soft, non-tender, gravid appropriate for gestational age EXTREMITIES: Nontender, no edema NEURO: alert and oriented SPECULUM EXAM: NEFG, moderate amount of dark red. Cervic clean, no polyps or lesions. BIMANUAL EXAM: Cervix closed. No CMT, andexal masses or tenderness.    Labs: Results for orders placed during the hospital encounter of 05/18/12 (from the past 24 hour(s))  URINALYSIS, ROUTINE W REFLEX MICROSCOPIC     Status: Abnormal   Collection Time    05/18/12  9:26 PM      Result Value Range   Color, Urine YELLOW  YELLOW   APPearance CLEAR  CLEAR   Specific Gravity, Urine 1.025  1.005 - 1.030   pH 6.0  5.0 - 8.0   Glucose, UA NEGATIVE  NEGATIVE mg/dL   Hgb urine dipstick LARGE (*) NEGATIVE   Bilirubin Urine NEGATIVE  NEGATIVE   Ketones, ur 15 (*) NEGATIVE mg/dL   Protein, ur 30 (*) NEGATIVE mg/dL   Urobilinogen, UA  0.2  0.0 - 1.0 mg/dL   Nitrite NEGATIVE  NEGATIVE   Leukocytes, UA NEGATIVE  NEGATIVE  URINE MICROSCOPIC-ADD ON     Status: None   Collection Time    05/18/12  9:26 PM      Result Value Range   Squamous Epithelial / LPF RARE  RARE   WBC, UA 0-2  <3 WBC/hpf   RBC / HPF TOO NUMEROUS TO COUNT  <3 RBC/hpf   Bacteria, UA RARE  RARE  POCT PREGNANCY, URINE     Status: None   Collection Time    05/18/12  9:37 PM      Result Value Range   Preg Test, Ur NEGATIVE  NEGATIVE  CBC     Status: Abnormal   Collection Time    05/18/12  9:50 PM      Result Value Range   WBC 12.3 (*) 4.0 - 10.5 K/uL   RBC 4.54  3.87 - 5.11 MIL/uL   Hemoglobin 13.5  12.0 - 15.0 g/dL   HCT 16.1  09.6 - 04.5 %   MCV 85.5  78.0 -  100.0 fL   MCH 29.7  26.0 - 34.0 pg   MCHC 34.8  30.0 - 36.0 g/dL   RDW 40.9  81.1 - 91.4 %   Platelets 261  150 - 400 K/uL    Imaging:  No results found. MAU Course:  Assessment: 1. Menorrhagia with irregular cycle    Plan: Discharge home in stable condition. Bleeding precautions. Megace taper per Dr. Erin Fulling.      Follow-up Information   Follow up with [facility] On 05/25/2012.   Contact information:   [address] [place] [address] [phone]      Follow up with [facility] MATERNITY ADMISSIONS. (As needed if symptoms worsen)    Contact information:   [address] [place] [address] [phone]       Medication List    TAKE these medications       medroxyPROGESTERone 150 MG/ML injection  Commonly known as:  DEPO-PROVERA  Inject 150 mg into the muscle every 3 (three) months.     megestrol 20 MG tablet  Commonly known as:  MEGACE  Take 1 tablet (20 mg total) by mouth daily. Take two tablets (40 mg) three times per day time three days,   then take two tablets (40 mg) two times per day time three days,   then take two tablets (40 mg)once per day        [place], CNM 05/18/2012 11:38 PM ,a

## 2012-05-18 NOTE — MAU Note (Signed)
Increase in bleeding noted after starting the depo shot. Passed more clots this past weekend. Pt. States she wears an overnight pad and changes its 3-4x per day. Also, having intermittent cramping.

## 2012-05-20 NOTE — MAU Provider Note (Signed)
Attestation of Attending Supervision of Advanced Practitioner (PA/CNM/NP): Evaluation and management procedures were performed by the Advanced Practitioner under my supervision and collaboration.  I have reviewed the Advanced Practitioner's note and chart, and I agree with the management and plan.  Stacey Maura, MD, FACOG Attending Obstetrician & Gynecologist Faculty Practice, [facility]  

## 2012-05-25 ENCOUNTER — Ambulatory Visit (INDEPENDENT_AMBULATORY_CARE_PROVIDER_SITE_OTHER): Payer: Medicaid Other | Admitting: Obstetrics & Gynecology

## 2012-05-25 VITALS — BP 137/102 | HR 102 | Temp 99.1°F | Ht 62.0 in | Wt 244.7 lb

## 2012-05-25 DIAGNOSIS — Z3009 Encounter for other general counseling and advice on contraception: Secondary | ICD-10-CM

## 2012-05-26 ENCOUNTER — Encounter: Payer: Self-pay | Admitting: Obstetrics & Gynecology

## 2012-05-26 NOTE — Patient Instructions (Signed)
Return to clinic for any scheduled appointments or for any gynecologic concerns as needed.   

## 2012-05-26 NOTE — Progress Notes (Signed)
GYNECOLOGY CLINIC PROGRESS NOTE  32 y.o. [id number] here today for preoperative appointment; has laparoscopic BTS scheduled on 06/19/12.  Today, patient reports that she has changed her mind; wants to discuss alternative, reversible contraception modalities. Reviewed reversible forms of birth control options available including abstinence; over the counter/barrier methods; hormonal contraceptive medication including pill, patch, ring, Depo-Provera injection, Nexplanon; Mirena and Paragard IUDs. Risks and benefits reviewed.  Questions were answered.  Patient desires Mirena IUD, she will return for placement. Information was given to patient to review at home.  OR notified of procedure cancellation.  Total encounter time: 15 minutes  Jaynie Collins, MD, FACOG Attending Obstetrician & Gynecologist Faculty Practice, [facility]

## 2012-06-06 ENCOUNTER — Inpatient Hospital Stay (HOSPITAL_COMMUNITY): Admission: RE | Admit: 2012-06-06 | Payer: Medicaid Other | Source: Ambulatory Visit

## 2012-06-18 ENCOUNTER — Encounter (HOSPITAL_COMMUNITY): Payer: Self-pay | Admitting: *Deleted

## 2012-06-18 ENCOUNTER — Inpatient Hospital Stay (HOSPITAL_COMMUNITY)
Admission: AD | Admit: 2012-06-18 | Discharge: 2012-06-18 | Disposition: A | Discharge status: Home / Self Care | Payer: Medicaid Other | Source: Ambulatory Visit | Attending: Obstetrics & Gynecology | Admitting: Obstetrics & Gynecology

## 2012-06-18 DIAGNOSIS — N949 Unspecified condition associated with female genital organs and menstrual cycle: Secondary | ICD-10-CM | POA: Insufficient documentation

## 2012-06-18 DIAGNOSIS — N938 Other specified abnormal uterine and vaginal bleeding: Secondary | ICD-10-CM | POA: Insufficient documentation

## 2012-06-18 DIAGNOSIS — N939 Abnormal uterine and vaginal bleeding, unspecified: Secondary | ICD-10-CM

## 2012-06-18 DIAGNOSIS — N898 Other specified noninflammatory disorders of vagina: Secondary | ICD-10-CM

## 2012-06-18 LAB — CBC
HCT: 38.3 % (ref 36.0–46.0)
Hemoglobin: 13.2 g/dL (ref 12.0–15.0)
MCH: 29.9 pg (ref 26.0–34.0)
MCHC: 34.5 g/dL (ref 30.0–36.0)
MCV: 86.7 fL (ref 78.0–100.0)
Platelets: 266 10*3/uL (ref 150–400)
RBC: 4.42 MIL/uL (ref 3.87–5.11)
RDW: 13.5 % (ref 11.5–15.5)
WBC: 8.7 10*3/uL (ref 4.0–10.5)

## 2012-06-18 LAB — POCT PREGNANCY, URINE: Preg Test, Ur: NEGATIVE

## 2012-06-18 NOTE — MAU Provider Note (Signed)
History     CSN: [id number]  Arrival date & time 06/18/12  1230   None     Chief Complaint  Patient presents with  . Vaginal Bleeding    (Consider location/radiation/quality/duration/timing/severity/associated sxs/prior treatment) HPI Jamie Hays is a 32 y.o. [id number] who presents with persistent vaginal bleeding. She received Depo Provera 03/24/12. Seh had MAU visit 4/17 for bleeding x 3 wks. Hgb was 13.5, she was changing 3x/d, Rx Megace taper. In April she cancelled her apt for LTC , plans to get Mirena 06/29/12 in GYN clinic. Today she c/o continued bleeding. The Megace decreased the bleeding, but it is still there. She changes 2-3x/d, not heavy, has brown to no bleeding at night. Occ clot, occ cramping. No other changes, same partner x 5 yr, neg STD screen 2/14. Past Medical History  Diagnosis Date  . Urinary tract infection   . Hypertension   . Headache     Past Surgical History  Procedure Laterality Date  . Cesarean section    . Breast reduction surgery  2000  . Cesarean section  07/03/2011    Procedure: CESAREAN SECTION;  Surgeon: Bing Plume, MD;  Location: [facility];  Service: Gynecology;  Laterality: N/A;  Repeat Cesarean Section Delivery Boy @ 0102, Apgars 9/9    Family History  Problem Relation Age of Onset  . Anesthesia problems Neg Hx   . Hypotension Neg Hx   . Malignant hyperthermia Neg Hx   . Pseudochol deficiency Neg Hx   . Diabetes Maternal Aunt   . Diabetes Maternal Uncle   . Cancer Maternal Grandmother     History  Substance Use Topics  . Smoking status: Never Smoker   . Smokeless tobacco: Never Used  . Alcohol Use: No    OB History   Grav Para Term Preterm Abortions TAB SAB Ect Mult Living   2 2 2       2       Review of Systems  Constitutional: Negative for fever and chills.  Genitourinary: Positive for vaginal bleeding. Negative for dysuria, urgency, frequency and vaginal discharge.  Neurological: Negative for dizziness, syncope and  light-headedness.    Allergies  Review of patient's allergies indicates no known allergies.  Home Medications  No current outpatient prescriptions on file.  BP 141/94  Pulse 84  Temp(Src) 98.5 F (36.9 C) (Oral)  Resp 18  Ht 5\' 2"  (1.575 m)  Wt 240 lb (108.863 kg)  BMI 43.89 kg/m2  Physical Exam  Constitutional: She is oriented to person, place, and time. She appears well-developed and well-nourished.  Genitourinary:  Pelvic exam: Vulva- nl anatomy,skin intact Vagina- small amt dark blood-cleaned with 1 scopette Cx- closed Uterus-no palpable enlargement Adn- non tender, no masses palp   Musculoskeletal: Normal range of motion.  Neurological: She is alert and oriented to person, place, and time.  Skin: Skin is warm and dry.  Psychiatric: She has a normal mood and affect. Her behavior is normal.    ED Course  Procedures (including critical care time)  Labs Reviewed  CBC  POCT PREGNANCY, URINE   No results found. Results for orders placed during the hospital encounter of 06/18/12 (from the past 24 hour(s))  POCT PREGNANCY, URINE     Status: None   Collection Time    06/18/12  1:05 PM      Result Value Range   Preg Test, Ur NEGATIVE  NEGATIVE  CBC     Status: None   Collection Time  06/18/12  1:20 PM      Result Value Range   WBC 8.7  4.0 - 10.5 K/uL   RBC 4.42  3.87 - 5.11 MIL/uL   Hemoglobin 13.2  12.0 - 15.0 g/dL   HCT 47.8  29.5 - 62.1 %   MCV 86.7  78.0 - 100.0 fL   MCH 29.9  26.0 - 34.0 pg   MCHC 34.5  30.0 - 36.0 g/dL   RDW 30.8  65.7 - 84.6 %   Platelets 266  150 - 400 K/uL     No diagnosis found. ASSESSMENT: Persistent vaginal bleeding on Depo Provera S/P Megace taper Hgb is 13.2 Small amt bleeding  PLAN:  Reasurred pt Keep appt for Mirena 5/29. Pt had called [facility]- where was previous pt- with c/o bleeding, they called in OCP's. Pt had not disclosed now being tx for hypertension with  HCTZ. Recommended not to use OCP's, to wait for  Mirena.      MDM

## 2012-06-18 NOTE — MAU Note (Signed)
Pt reports she has been bleeding since march. Had ben given megace 4 weeks ago bleeding slowed down but did not stop.

## 2012-06-19 ENCOUNTER — Ambulatory Visit: Admit: 2012-06-19 | Payer: Medicaid Other | Admitting: Obstetrics & Gynecology

## 2012-06-19 SURGERY — LIGATION, FALLOPIAN TUBE, LAPAROSCOPIC
Anesthesia: Choice | Site: Abdomen | Laterality: Bilateral

## 2012-06-20 ENCOUNTER — Emergency Department (HOSPITAL_COMMUNITY)
Admission: EM | Admit: 2012-06-20 | Discharge: 2012-06-21 | Disposition: A | Discharge status: Home / Self Care | Payer: Medicaid Other | Attending: Emergency Medicine | Admitting: Emergency Medicine

## 2012-06-20 DIAGNOSIS — R51 Headache: Secondary | ICD-10-CM | POA: Insufficient documentation

## 2012-06-20 DIAGNOSIS — H9319 Tinnitus, unspecified ear: Secondary | ICD-10-CM | POA: Insufficient documentation

## 2012-06-20 DIAGNOSIS — I10 Essential (primary) hypertension: Secondary | ICD-10-CM | POA: Insufficient documentation

## 2012-06-20 DIAGNOSIS — H9311 Tinnitus, right ear: Secondary | ICD-10-CM

## 2012-06-20 DIAGNOSIS — Z79899 Other long term (current) drug therapy: Secondary | ICD-10-CM | POA: Insufficient documentation

## 2012-06-20 DIAGNOSIS — Z87440 Personal history of urinary (tract) infections: Secondary | ICD-10-CM | POA: Insufficient documentation

## 2012-06-20 NOTE — ED Notes (Signed)
Pt states that yesterday she started having ringing in her R ear and pain on R side of head. Denies ear drainage or sinus problems.

## 2012-06-21 MED ORDER — IBUPROFEN 800 MG PO TABS: 800.0000 mg | ORAL_TABLET | Freq: Three times a day (TID) | ORAL | Status: DC | PRN

## 2012-06-21 MED ORDER — IBUPROFEN 800 MG PO TABS
800.0000 mg | ORAL_TABLET | Freq: Once | ORAL | Status: AC
  Administered 2012-06-21: 800 mg via ORAL
  Filled 2012-06-21: qty 1

## 2012-06-21 NOTE — ED Notes (Signed)
Pt states that she started having pain on the right side of head and then ringing in her ear around 1930 on 5/20. Pt denies any ringing in her ear at this time. Pt states she really doesn't know if she had any vision changed because the lights in her house were dim.

## 2012-06-21 NOTE — ED Provider Notes (Signed)
History     CSN: [id number]  Arrival date & time 06/20/12  2225   First MD Initiated Contact with Patient 06/21/12 0101      Chief Complaint  Patient presents with  . Headache  . Tinnitus    (Consider location/radiation/quality/duration/timing/severity/associated sxs/prior treatment) HPI Comments: Patient is a 32 year old female with a history of hypertension who presents for intermittent throbbing headache of R temporal region without thunderclap onset x 2 days. Patient states that pain is without aggravating or alleviating factors. Associated with episodic tinnitus with onset today lasting "seconds"; patient admits to 5 episodes of tinnitus in the last 5 hours. Patient denies fevers, neck pain or stiffness, congestion, rhinorrhea, ear discharge or pain, visual disturbances, difficulty speaking or swallowing, and numbness or tingling in her extremities.  Patient is a 32 y.o. female presenting with headaches. The history is provided by the patient. No language interpreter was used.  Headache Associated symptoms: no congestion, no dizziness, no ear pain, no pain, no fever, no neck pain, no neck stiffness, no numbness and no sinus pressure     Past Medical History  Diagnosis Date  . Urinary tract infection   . Hypertension   . Headache     Past Surgical History  Procedure Laterality Date  . Cesarean section    . Breast reduction surgery  2000  . Cesarean section  07/03/2011    Procedure: CESAREAN SECTION;  Surgeon: Bing Plume, MD;  Location: [facility];  Service: Gynecology;  Laterality: N/A;  Repeat Cesarean Section Delivery Boy @ 0102, Apgars 9/9    Family History  Problem Relation Age of Onset  . Anesthesia problems Neg Hx   . Hypotension Neg Hx   . Malignant hyperthermia Neg Hx   . Pseudochol deficiency Neg Hx   . Diabetes Maternal Aunt   . Diabetes Maternal Uncle   . Cancer Maternal Grandmother     History  Substance Use Topics  . Smoking status: Never Smoker   .  Smokeless tobacco: Never Used  . Alcohol Use: No    OB History   Grav Para Term Preterm Abortions TAB SAB Ect Mult Living   2 2 2       2       Review of Systems  Constitutional: Negative for fever.  HENT: Positive for tinnitus. Negative for ear pain, congestion, rhinorrhea, trouble swallowing, neck pain, neck stiffness, sinus pressure and ear discharge.   Eyes: Negative for pain, redness and visual disturbance.  Respiratory: Negative for shortness of breath.   Cardiovascular: Negative for chest pain.  Neurological: Positive for headaches. Negative for dizziness, syncope, weakness, light-headedness and numbness.  All other systems reviewed and are negative.    Allergies  Review of patient's allergies indicates no known allergies.  Home Medications   Current Outpatient Rx  Name  Route  Sig  Dispense  Refill  . hydrochlorothiazide (HYDRODIURIL) 25 MG tablet   Oral   Take 25 mg by mouth daily.         . medroxyPROGESTERone (DEPO-PROVERA) 150 MG/ML injection   Intramuscular   Inject 150 mg into the muscle every 3 (three) months.         Marland Kitchen ibuprofen (ADVIL,MOTRIN) 800 MG tablet   Oral   Take 1 tablet (800 mg total) by mouth every 8 (eight) hours as needed for pain.   21 tablet   0     BP 138/91  Pulse 89  Temp(Src) 99.3 F (37.4 C) (Oral)  Resp 18  Wt 240 lb (108.863 kg)  BMI 43.89 kg/m2  SpO2 99%  LMP 04/20/2012  Physical Exam  Nursing note and vitals reviewed. Constitutional: She is oriented to person, place, and time. She appears well-developed and well-nourished. No distress.  HENT:  Head: Normocephalic and atraumatic.  Right Ear: External ear normal. No tenderness. No mastoid tenderness.  Left Ear: Tympanic membrane, external ear and ear canal normal. No tenderness. No mastoid tenderness.  Nose: Nose normal.  Mouth/Throat: Uvula is midline, oropharynx is clear and moist and mucous membranes are normal. No oropharyngeal exudate.  R TM dull with very  mild erythema; no bulging or retraction. R canal very mildly erythematous. Exam of L ear normal.  Eyes: Conjunctivae and EOM are normal. Pupils are equal, round, and reactive to light. Right eye exhibits no discharge. Left eye exhibits no discharge. No scleral icterus.  Neck: Normal range of motion. Neck supple.  No nuchal rigidity  Cardiovascular: Normal rate, regular rhythm, normal heart sounds and intact distal pulses.   Pulmonary/Chest: Effort normal and breath sounds normal. No respiratory distress. She has no wheezes. She has no rales.  Musculoskeletal: Normal range of motion. She exhibits no edema.  Lymphadenopathy:    She has no cervical adenopathy.  Neurological: She is alert and oriented to person, place, and time. She has normal reflexes. No cranial nerve deficit.  Cranial nerves II through XII grossly intact. Patient exhibits equal grip strength bilaterally with 5 out of 5 strength against resistance in her upper and lower extremities. DTRs normal and symmetric. Patient is ambulatory with normal gait. No sensory or motor deficits appreciated.  Skin: Skin is warm and dry. No rash noted. She is not diaphoretic. No erythema. No pallor.  Psychiatric: She has a normal mood and affect. Her behavior is normal.    ED Course  Procedures (including critical care time)  Labs Reviewed - No data to display No results found.   1. Headache   2. Tinnitus, right     MDM  Patient presents for intermittent throbbing headache of R temporal region without thunderclap onset x 2 days with associated tinnitus x 5 hours. On physical exam patient is well and nontoxic appearing, in no acute distress. She is neurovascularly intact without sensory or motor deficits; speaks in full goal oriented sentences and moves extremities without ataxia. Ibuprofen ordered for headache. Patient denies tinnitus at this time.  Symptoms consistent with uncomplicated headache. Very low suspicion of temporal arteritis  given age, ethnicity and lack of associated symptoms. Doubt intracranial mass, hemorrhage, or infarct given age, lack of focal neurologic deficits, and lack of "thunderclap onset"; patient hemodynamically stable. Patient appropriate for discharge with primary care followup for further evaluation of symptoms; resource guide provided. Indications for ED return provided. Patient work up and management discussed with Dr. Norlene Campbell who is in agreement.      Antony Madura, PA-C 06/21/12 [phone]

## 2012-06-21 NOTE — ED Provider Notes (Signed)
Medical screening examination/treatment/procedure(s) were performed by non-physician practitioner and as supervising physician I was immediately available for consultation/collaboration.  Eesa Justiss M Desarie Feild, MD 06/21/12 0555 

## 2012-06-29 ENCOUNTER — Encounter: Payer: Self-pay | Admitting: Obstetrics & Gynecology

## 2012-06-29 ENCOUNTER — Ambulatory Visit (INDEPENDENT_AMBULATORY_CARE_PROVIDER_SITE_OTHER): Payer: Medicaid Other | Admitting: Obstetrics & Gynecology

## 2012-06-29 VITALS — BP 122/92 | HR 96 | Temp 97.3°F | Ht 62.0 in | Wt 235.0 lb

## 2012-06-29 DIAGNOSIS — N921 Excessive and frequent menstruation with irregular cycle: Secondary | ICD-10-CM

## 2012-06-29 DIAGNOSIS — I10 Essential (primary) hypertension: Secondary | ICD-10-CM

## 2012-06-29 MED ORDER — NORGESTIMATE-ETH ESTRADIOL 0.25-35 MG-MCG PO TABS: 1.0000 | ORAL_TABLET | Freq: Every day | ORAL | Status: DC

## 2012-06-29 NOTE — Progress Notes (Signed)
Patient reports bleeding constantly since  3/20, heavy at times with passing clots but never spotting. Denies abdominal pain. Patient states she is tired of all this bleeding and is very concerned about endometrial cancer despite reassurance from health care providers and is too worried about the bleeding to even think about birth control. Patient received first depo shot 7/13 then missed several doses and had second depo shot in February. States the provera they gave her in [facility] slowed it down a little but it never stopped.

## 2012-06-29 NOTE — Progress Notes (Signed)
Subjective:   # Irregular Bleeding related to depo provera injection Patient seen on 2/21 and given depo-provera. 1 month later started with irregular bleeding which she says occurred on most day since she had her shots. Heavier than spotting with occasional clots. At times is heavy but not currently.  Seen on [facility] on 4/17 and 5/18 with internal exams on each visit. On 4/17 given megace taper which temporarily improved symptoms which worsened around time of second visit to [facility]. During this time patient had considered a BTL but changed her mind on 4/24 and opted for IUD. Today, patient states she does not want IUD. Also does not want depo provera. She would like to try birth control pills and is hopeful that irregular bleeding will soon slow. Bleeding has started to slow again in recent days.   ROS--See HPI with additions of no fatigue, tiredness (cbc within 10 day swith hgb 13.2). No headache blurry vision, shortness of breath or chest pain.   Family History-no history of endometrial cancer Past Medical History-hypertension on HCTZ Reviewed problem list.  Medications- reviewed and updated Chief complaint-noted  Objective: BP 122/92  Pulse 96  Temp(Src) 97.3 F (36.3 C) (Oral)  Ht 5\' 2"  (1.575 m)  Wt 235 lb (106.595 kg)  BMI 42.97 kg/m2  LMP 04/20/2012  Breastfeeding? No Gen: NAD, resting comfortably CV: RRR no murmurs rubs or gallops Lungs: CTAB no crackles, wheeze, rhonchi Skin: warm, dry, acanthosis nigricans noted on neck Neuro: grossly normal, moves all extremities Pelvic: deferred  Assessment/Plan:  # Irregular Bleeding related to depo provera injection Opts for birth control pills today. Patient to follow up in 3 months for BP check or sooner if symptoms worsen or fail to control on birth control pills over the next few weeks.

## 2012-06-29 NOTE — Patient Instructions (Addendum)
You are having irregular bleeding due to your depo provera shots. You have made the decision to transition to birth control pills instead of continuing depo or getting a Mirena IUD. Call us if blood pressure becomes elevated at home so we can have you in for a recheck.   Medroxyprogesterone injection [Contraceptive] What is this medicine? MEDROXYPROGESTERONE (me DROX ee proe JES te rone) contraceptive injections prevent pregnancy. They provide effective birth control for 3 months. Depo-subQ Provera 104 is also used for treating pain related to endometriosis. This medicine may be used for other purposes; ask your health care provider or pharmacist if you have questions. What should I tell my health care provider before I take this medicine? They need to know if you have any of these conditions: -frequently drink alcohol -asthma -blood vessel disease or a history of a blood clot in the lungs or legs -bone disease such as osteoporosis -breast cancer -diabetes -eating disorder (anorexia nervosa or bulimia) -high blood pressure -HIV infection or AIDS -kidney disease -liver disease -mental depression -migraine -seizures (convulsions) -stroke -tobacco smoker -vaginal bleeding -an unusual or allergic reaction to medroxyprogesterone, other hormones, medicines, foods, dyes, or preservatives -pregnant or trying to get pregnant -breast-feeding How should I use this medicine? Depo-Provera Contraceptive injection is given into a muscle. Depo-subQ Provera 104 injection is given under the skin. These injections are given by a health care professional. You must not be pregnant before getting an injection. The injection is usually given during the first 5 days after the start of a menstrual period or 6 weeks after delivery of a baby. Talk to your pediatrician regarding the use of this medicine in children. Special care may be needed. These injections have been used in female children who have started  having menstrual periods. Overdosage: If you think you have taken too much of this medicine contact a poison control center or emergency room at once. NOTE: This medicine is only for you. Do not share this medicine with others. What if I miss a dose? Try not to miss a dose. You must get an injection once every 3 months to maintain birth control. If you cannot keep an appointment, call and reschedule it. If you wait longer than 13 weeks between Depo-Provera contraceptive injections or longer than 14 weeks between Depo-subQ Provera 104 injections, you could get pregnant. Use another method for birth control if you miss your appointment. You may also need a pregnancy test before receiving another injection. What may interact with this medicine? Do not take this medicine with any of the following medications: -bosentan This medicine may also interact with the following medications: -aminoglutethimide -antibiotics or medicines for infections, especially rifampin, rifabutin, rifapentine, and griseofulvin -aprepitant -barbiturate medicines such as phenobarbital or primidone -bexarotene -carbamazepine -medicines for seizures like ethotoin, felbamate, oxcarbazepine, phenytoin, topiramate -modafinil -St. John's wort This list may not describe all possible interactions. Give your health care provider a list of all the medicines, herbs, non-prescription drugs, or dietary supplements you use. Also tell them if you smoke, drink alcohol, or use illegal drugs. Some items may interact with your medicine. What should I watch for while using this medicine? This drug does not protect you against HIV infection (AIDS) or other sexually transmitted diseases. Use of this product may cause you to lose calcium from your bones. Loss of calcium may cause weak bones (osteoporosis). Only use this product for more than 2 years if other forms of birth control are not right for you. The longer you  use this product for birth  control the more likely you will be at risk for weak bones. Ask your health care professional how you can keep strong bones. You may have a change in bleeding pattern or irregular periods. Many females stop having periods while taking this drug. If you have received your injections on time, your chance of being pregnant is very low. If you think you may be pregnant, see your health care professional as soon as possible. Tell your health care professional if you want to get pregnant within the next year. The effect of this medicine may last a long time after you get your last injection. What side effects may I notice from receiving this medicine? Side effects that you should report to your doctor or health care professional as soon as possible: -allergic reactions like skin rash, itching or hives, swelling of the face, lips, or tongue -breast tenderness or discharge -breathing problems -changes in vision -depression -feeling faint or lightheaded, falls -fever -pain in the abdomen, chest, groin, or leg -problems with balance, talking, walking -unusually weak or tired -yellowing of the eyes or skin Side effects that usually do not require medical attention (report to your doctor or health care professional if they continue or are bothersome): -acne -fluid retention and swelling -headache -irregular periods, spotting, or absent periods -temporary pain, itching, or skin reaction at site where injected -weight gain This list may not describe all possible side effects. Call your doctor for medical advice about side effects. You may report side effects to FDA at 1-800-FDA-1088. Where should I keep my medicine? This does not apply. The injection will be given to you by a health care professional. NOTE: This sheet is a summary. It may not cover all possible information. If you have questions about this medicine, talk to your doctor, pharmacist, or health care provider.  2013, [employer]. (02/09/2008 6:37:56 PM)

## 2012-07-05 ENCOUNTER — Encounter (HOSPITAL_COMMUNITY): Payer: Self-pay

## 2012-07-05 ENCOUNTER — Inpatient Hospital Stay (HOSPITAL_COMMUNITY)
Admission: AD | Admit: 2012-07-05 | Discharge: 2012-07-05 | Disposition: A | Discharge status: Home / Self Care | Payer: Medicaid Other | Source: Ambulatory Visit | Attending: Obstetrics and Gynecology | Admitting: Obstetrics and Gynecology

## 2012-07-05 ENCOUNTER — Inpatient Hospital Stay (HOSPITAL_COMMUNITY): Payer: Self-pay

## 2012-07-05 DIAGNOSIS — N949 Unspecified condition associated with female genital organs and menstrual cycle: Secondary | ICD-10-CM

## 2012-07-05 DIAGNOSIS — N925 Other specified irregular menstruation: Secondary | ICD-10-CM

## 2012-07-05 DIAGNOSIS — N390 Urinary tract infection, site not specified: Secondary | ICD-10-CM

## 2012-07-05 DIAGNOSIS — N938 Other specified abnormal uterine and vaginal bleeding: Secondary | ICD-10-CM | POA: Insufficient documentation

## 2012-07-05 HISTORY — DX: Anxiety disorder, unspecified: F41.9

## 2012-07-05 LAB — URINALYSIS, ROUTINE W REFLEX MICROSCOPIC
Bilirubin Urine: NEGATIVE
Glucose, UA: NEGATIVE mg/dL
Ketones, ur: 15 mg/dL — AB
pH: 8 (ref 5.0–8.0)

## 2012-07-05 LAB — CBC
Hemoglobin: 12.3 g/dL (ref 12.0–15.0)
MCH: 29.9 pg (ref 26.0–34.0)
MCV: 86.7 fL (ref 78.0–100.0)
Platelets: 273 10*3/uL (ref 150–400)
RBC: 4.12 MIL/uL (ref 3.87–5.11)

## 2012-07-05 LAB — WET PREP, GENITAL
Clue Cells Wet Prep HPF POC: NONE SEEN
Trich, Wet Prep: NONE SEEN
WBC, Wet Prep HPF POC: NONE SEEN
Yeast Wet Prep HPF POC: NONE SEEN

## 2012-07-05 LAB — TSH: TSH: 2.645 u[IU]/mL (ref 0.350–4.500)

## 2012-07-05 MED ORDER — IBUPROFEN 800 MG PO TABS: 800.0000 mg | ORAL_TABLET | Freq: Three times a day (TID) | ORAL | Status: DC | PRN

## 2012-07-05 MED ORDER — SULFAMETHOXAZOLE-TMP DS 800-160 MG PO TABS: 1.0000 | ORAL_TABLET | Freq: Two times a day (BID) | ORAL | Status: DC

## 2012-07-05 MED ORDER — PHENAZOPYRIDINE HCL 200 MG PO TABS: 200.0000 mg | ORAL_TABLET | Freq: Three times a day (TID) | ORAL | Status: DC | PRN

## 2012-07-05 MED ORDER — KETOROLAC TROMETHAMINE 60 MG/2ML IM SOLN
60.0000 mg | Freq: Once | INTRAMUSCULAR | Status: AC
  Administered 2012-07-05: 60 mg via INTRAMUSCULAR
  Filled 2012-07-05: qty 2

## 2012-07-05 NOTE — MAU Provider Note (Signed)
History     CSN: [id number]  Arrival date and time: 07/05/12 1423   None     Chief Complaint  Patient presents with  . Vaginal Bleeding   HPI 32 y.o. [id number] with ongoing vaginal bleeding since March which she has been evaluated multiple times for in MAU and GYN clinic. States she bleeds like a period every day. Now having cramping, has not taken any medications at home for pain relief. Hgb has been greater than 13 at last 2 checks. Had Depo Provera shot in [place]. Had planned on getting IUD on 5/29 in GYN clinic, but opted for OCPs at that time instead. Has been taking OCPs for 6 days, states it seems like the bleeding might be getting worse. Prior to this has had provera and megace, which decreased the bleeding some, but did not stop it completely. States she doesn't want to take any other birth control or hormone pills for the bleeding.   Past Medical History  Diagnosis Date  . Urinary tract infection   . Hypertension   . Headache(784.0)   . Anxiety     Past Surgical History  Procedure Laterality Date  . Cesarean section    . Breast reduction surgery  2000  . Cesarean section  07/03/2011    Procedure: CESAREAN SECTION;  Surgeon: Bing Plume, MD;  Location: [facility];  Service: Gynecology;  Laterality: N/A;  Repeat Cesarean Section Delivery Boy @ 0102, Apgars 9/9    Family History  Problem Relation Age of Onset  . Anesthesia problems Neg Hx   . Hypotension Neg Hx   . Malignant hyperthermia Neg Hx   . Pseudochol deficiency Neg Hx   . Diabetes Maternal Aunt   . Diabetes Maternal Uncle   . Cancer Maternal Grandmother     History  Substance Use Topics  . Smoking status: Never Smoker   . Smokeless tobacco: Never Used  . Alcohol Use: No    Allergies: No Known Allergies  Prescriptions prior to admission  Medication Sig Dispense Refill  . acetaminophen (TYLENOL) 500 MG tablet Take 1,000 mg by mouth every 6 (six) hours as needed for pain.      . hydrochlorothiazide  (HYDRODIURIL) 25 MG tablet Take 25 mg by mouth daily.      . norgestimate-ethinyl estradiol (ORTHO-CYCLEN,SPRINTEC,PREVIFEM) 0.25-35 MG-MCG tablet Take 1 tablet by mouth daily.  1 Package  11    Review of Systems  Constitutional: Negative.   Respiratory: Negative.   Cardiovascular: Negative.   Gastrointestinal: Positive for abdominal pain. Negative for nausea, vomiting, diarrhea and constipation.  Genitourinary: Negative for dysuria, urgency, frequency, hematuria and flank pain.       Positive bleeding   Musculoskeletal: Negative.   Neurological: Positive for headaches.  Psychiatric/Behavioral: Negative.    Physical Exam   Blood pressure 134/89, pulse 96, temperature 98.3 F (36.8 C), temperature source Oral, resp. rate 18, last menstrual period 04/20/2012, SpO2 100.00%, not currently breastfeeding.  Physical Exam  Nursing note and vitals reviewed. Constitutional: She is oriented to person, place, and time. She appears well-developed and well-nourished. No distress.  Obese   HENT:  Head: Normocephalic and atraumatic.  Cardiovascular: Normal rate, regular rhythm and normal heart sounds.   Respiratory: Effort normal and breath sounds normal. No respiratory distress.  GI: Soft. She exhibits no distension and no mass. There is no tenderness. There is no rebound and no guarding.  Genitourinary: There is no rash or lesion on the right labia. There is no rash or  lesion on the left labia. Uterus is tender (mild). Uterus is not deviated, not enlarged and not fixed. Cervix exhibits no motion tenderness, no discharge and no friability. Right adnexum displays tenderness (mild). Right adnexum displays no mass and no fullness. Left adnexum displays tenderness (mild). Left adnexum displays no mass and no fullness. There is bleeding (small) around the vagina. No erythema or tenderness around the vagina. No vaginal discharge found.  Exam limited by body habitus   Neurological: She is alert and  oriented to person, place, and time.  Skin: Skin is warm and dry.  Psychiatric: She has a normal mood and affect.    MAU Course  Procedures Results for orders placed during the hospital encounter of 07/05/12 (from the past 24 hour(s))  CBC     Status: Abnormal   Collection Time    07/05/12  2:35 PM      Result Value Range   WBC 11.4 (*) 4.0 - 10.5 K/uL   RBC 4.12  3.87 - 5.11 MIL/uL   Hemoglobin 12.3  12.0 - 15.0 g/dL   HCT 78.2 (*) 95.6 - 21.3 %   MCV 86.7  78.0 - 100.0 fL   MCH 29.9  26.0 - 34.0 pg   MCHC 34.5  30.0 - 36.0 g/dL   RDW 08.6  57.8 - 46.9 %   Platelets 273  150 - 400 K/uL  URINALYSIS, ROUTINE W REFLEX MICROSCOPIC     Status: Abnormal   Collection Time    07/05/12  2:42 PM      Result Value Range   Color, Urine RED (*) YELLOW   APPearance CLOUDY (*) CLEAR   Specific Gravity, Urine 1.020  1.005 - 1.030   pH 8.0  5.0 - 8.0   Glucose, UA NEGATIVE  NEGATIVE mg/dL   Hgb urine dipstick LARGE (*) NEGATIVE   Bilirubin Urine NEGATIVE  NEGATIVE   Ketones, ur 15 (*) NEGATIVE mg/dL   Protein, ur 629 (*) NEGATIVE mg/dL   Urobilinogen, UA 1.0  0.0 - 1.0 mg/dL   Nitrite POSITIVE (*) NEGATIVE   Leukocytes, UA SMALL (*) NEGATIVE  URINE MICROSCOPIC-ADD ON     Status: None   Collection Time    07/05/12  2:42 PM      Result Value Range   WBC, UA 0-2  <3 WBC/hpf   RBC / HPF TOO NUMEROUS TO COUNT  <3 RBC/hpf   Urine-Other MICROSCOPIC EXAM PERFORMED ON UNCONCENTRATED URINE    WET PREP, GENITAL     Status: None   Collection Time    07/05/12  3:06 PM      Result Value Range   Yeast Wet Prep HPF POC NONE SEEN  NONE SEEN   Trich, Wet Prep NONE SEEN  NONE SEEN   Clue Cells Wet Prep HPF POC NONE SEEN  NONE SEEN   WBC, Wet Prep HPF POC NONE SEEN  NONE SEEN  POCT PREGNANCY, URINE     Status: None   Collection Time    07/05/12  3:16 PM      Result Value Range   Preg Test, Ur NEGATIVE  NEGATIVE   US Transvaginal Non-ob  07/05/2012   *RADIOLOGY REPORT*  Clinical Data:  Dysfunctional uterine bleeding.  TRANSABDOMINAL AND TRANSVAGINAL ULTRASOUND OF PELVIS  Technique:  Both transabdominal and transvaginal ultrasound examinations of the pelvis were performed.  Transabdominal technique was performed for global imaging of the pelvis including uterus, ovaries, adnexal regions, and pelvic cul-de-sac.  It was necessary to proceed with  endovaginal exam following the transabdominal exam to visualize the endometrium and left ovary.  Comparison:  None.  Findings: Uterus:  9.1 x 5.3 x 6.7 cm.  No fibroids or other uterine masses identified.  Endometrium: Double layer thickness measures 14 mm transvaginally. No focal lesion visualized.  Right ovary: 2.8 x 1.8 x 2.2 cm.  Normal appearance.  No adnexal mass identified.  Left ovary: 3.0 x 1.6 x 2.7 cm.  Normal appearance.  No adnexal mass identified.  Other Findings:  No free fluid  IMPRESSION:  1.  No fibroids identified.  Endometrial thickness measures 14 mm. If bleeding remains unresponsive to hormonal or medical therapy, sonohysterogram should be considered for focal lesion work-up. (Ref:  Radiological Reasoning: Algorithmic Workup of Abnormal Vaginal Bleeding with Endovaginal Sonography and Sonohysterography. AJR 2008; 782:N56-21) 2.  Normal appearance of both ovaries. No adnexal mass identified.   Original Report Authenticated By: Myles Rosenthal, M.D.   US Pelvis Complete  07/05/2012   *RADIOLOGY REPORT*  Clinical Data: Dysfunctional uterine bleeding.  TRANSABDOMINAL AND TRANSVAGINAL ULTRASOUND OF PELVIS  Technique:  Both transabdominal and transvaginal ultrasound examinations of the pelvis were performed.  Transabdominal technique was performed for global imaging of the pelvis including uterus, ovaries, adnexal regions, and pelvic cul-de-sac.  It was necessary to proceed with endovaginal exam following the transabdominal exam to visualize the endometrium and left ovary.  Comparison:  None.  Findings: Uterus:  9.1 x 5.3 x 6.7 cm.  No fibroids  or other uterine masses identified.  Endometrium: Double layer thickness measures 14 mm transvaginally. No focal lesion visualized.  Right ovary: 2.8 x 1.8 x 2.2 cm.  Normal appearance.  No adnexal mass identified.  Left ovary: 3.0 x 1.6 x 2.7 cm.  Normal appearance.  No adnexal mass identified.  Other Findings:  No free fluid  IMPRESSION:  1.  No fibroids identified.  Endometrial thickness measures 14 mm. If bleeding remains unresponsive to hormonal or medical therapy, sonohysterogram should be considered for focal lesion work-up. (Ref:  Radiological Reasoning: Algorithmic Workup of Abnormal Vaginal Bleeding with Endovaginal Sonography and Sonohysterography. AJR 2008; 308:M57-84) 2.  Normal appearance of both ovaries. No adnexal mass identified.   Original Report Authenticated By: Myles Rosenthal, M.D.    Assessment and Plan   1. DUB (dysfunctional uterine bleeding)   2. UTI (urinary tract infection)   Continue OCPs for bleeding as prescribed. TSH added to labs today. F/U in GYN clinic for further eval if bleeding continues.     Medication List    TAKE these medications       acetaminophen 500 MG tablet  Commonly known as:  TYLENOL  Take 1,000 mg by mouth every 6 (six) hours as needed for pain.     hydrochlorothiazide 25 MG tablet  Commonly known as:  HYDRODIURIL  Take 25 mg by mouth daily.     ibuprofen 800 MG tablet  Commonly known as:  ADVIL,MOTRIN  Take 1 tablet (800 mg total) by mouth every 8 (eight) hours as needed for pain.     norgestimate-ethinyl estradiol 0.25-35 MG-MCG tablet  Commonly known as:  ORTHO-CYCLEN,SPRINTEC,PREVIFEM  Take 1 tablet by mouth daily.     phenazopyridine 200 MG tablet  Commonly known as:  PYRIDIUM  Take 1 tablet (200 mg total) by mouth 3 (three) times daily as needed for pain.     sulfamethoxazole-trimethoprim 800-160 MG per tablet  Commonly known as:  BACTRIM DS  Take 1 tablet by mouth 2 (two) times daily.  Follow-up Information    Follow up with [facility]. Schedule an appointment as soon as possible for a visit in 4 weeks.   Contact information:   [address] [place] [address] [phone]        Paddy Neis 07/05/2012, 3:46 PM

## 2012-07-05 NOTE — MAU Note (Signed)
Pt states here for bleeding that began mid-March. Cramps began Monday and got worse, now feels like knives in lower abdomen. States clots are getting bigger, now silver dollar sized, some are long and stringy.

## 2012-07-09 NOTE — MAU Provider Note (Signed)
Attestation of Attending Supervision of Advanced Practitioner (CNM/NP): Evaluation and management procedures were performed by the Advanced Practitioner under my supervision and collaboration.  I have reviewed the Advanced Practitioner's note and chart, and I agree with the management and plan.  Dammon Makarewicz 07/09/2012 9:17 AM

## 2012-08-16 ENCOUNTER — Ambulatory Visit: Payer: Medicaid Other | Admitting: Obstetrics & Gynecology

## 2012-08-26 ENCOUNTER — Encounter (HOSPITAL_COMMUNITY): Payer: Self-pay | Admitting: Emergency Medicine

## 2012-08-26 ENCOUNTER — Emergency Department (HOSPITAL_COMMUNITY)
Admission: EM | Admit: 2012-08-26 | Discharge: 2012-08-27 | Disposition: A | Discharge status: Home / Self Care | Payer: Medicaid Other | Attending: Emergency Medicine | Admitting: Emergency Medicine

## 2012-08-26 DIAGNOSIS — Z8659 Personal history of other mental and behavioral disorders: Secondary | ICD-10-CM | POA: Insufficient documentation

## 2012-08-26 DIAGNOSIS — I10 Essential (primary) hypertension: Secondary | ICD-10-CM | POA: Insufficient documentation

## 2012-08-26 DIAGNOSIS — M542 Cervicalgia: Secondary | ICD-10-CM | POA: Insufficient documentation

## 2012-08-26 DIAGNOSIS — Z87440 Personal history of urinary (tract) infections: Secondary | ICD-10-CM | POA: Insufficient documentation

## 2012-08-26 DIAGNOSIS — M2569 Stiffness of other specified joint, not elsewhere classified: Secondary | ICD-10-CM | POA: Insufficient documentation

## 2012-08-26 DIAGNOSIS — M436 Torticollis: Secondary | ICD-10-CM | POA: Insufficient documentation

## 2012-08-26 DIAGNOSIS — Z79899 Other long term (current) drug therapy: Secondary | ICD-10-CM | POA: Insufficient documentation

## 2012-08-26 DIAGNOSIS — R6884 Jaw pain: Secondary | ICD-10-CM | POA: Insufficient documentation

## 2012-08-26 NOTE — ED Notes (Signed)
Pt c/o L sided stiff neck onset yesterday, radiating to R neck at this time. Pt also would like L breast examined, pt believes she feels lump. Pt c/o L jaw pain onset 3 days ago. Denies injury

## 2012-08-27 ENCOUNTER — Other Ambulatory Visit: Payer: Self-pay

## 2012-08-27 ENCOUNTER — Emergency Department (HOSPITAL_COMMUNITY): Payer: Medicaid Other

## 2012-08-27 MED ORDER — IBUPROFEN 800 MG PO TABS
800.0000 mg | ORAL_TABLET | Freq: Once | ORAL | Status: AC
  Administered 2012-08-27: 800 mg via ORAL
  Filled 2012-08-27: qty 1

## 2012-08-27 MED ORDER — DIAZEPAM 5 MG PO TABS: 5.0000 mg | ORAL_TABLET | Freq: Two times a day (BID) | ORAL | Status: DC

## 2012-08-27 MED ORDER — TRAMADOL HCL 50 MG PO TABS: 50.0000 mg | ORAL_TABLET | Freq: Four times a day (QID) | ORAL | Status: DC | PRN

## 2012-08-27 MED ORDER — IBUPROFEN 800 MG PO TABS: 800.0000 mg | ORAL_TABLET | Freq: Three times a day (TID) | ORAL | Status: DC

## 2012-08-27 MED ORDER — CYCLOBENZAPRINE HCL 10 MG PO TABS
10.0000 mg | ORAL_TABLET | Freq: Once | ORAL | Status: AC
  Administered 2012-08-27: 10 mg via ORAL
  Filled 2012-08-27 (×2): qty 1

## 2012-08-27 NOTE — ED Provider Notes (Signed)
CSN: [id number]     Arrival date & time 08/26/12  2305 History     First MD Initiated Contact with Patient 08/26/12 2353     Chief Complaint  Patient presents with  . Torticollis   (Consider location/radiation/quality/duration/timing/severity/associated sxs/prior Treatment) HPI History provided by patient. Woke up 5 days ago with left sided neck pain and stiffness. Hurts on the side of her neck, hurts to move it also radiates to left lower jaw. She has been using heating pad and feeling somewhat better, and then pain returned low but worse yesterday. She took some hydrocodone without relief. She has 2 children at home both of which she lifts frequently and has also been moving.  She denies any specific injury. No h/o of same. No associated weakness or numbness. no chest pain or shortness of breath.  Past Medical History  Diagnosis Date  . Urinary tract infection   . Hypertension   . Headache(784.0)   . Anxiety    Past Surgical History  Procedure Laterality Date  . Cesarean section    . Breast reduction surgery  2000  . Cesarean section  07/03/2011    Procedure: CESAREAN SECTION;  Surgeon: Bing Plume, MD;  Location: [facility];  Service: Gynecology;  Laterality: N/A;  Repeat Cesarean Section Delivery Boy @ 0102, Apgars 9/9   Family History  Problem Relation Age of Onset  . Anesthesia problems Neg Hx   . Hypotension Neg Hx   . Malignant hyperthermia Neg Hx   . Pseudochol deficiency Neg Hx   . Diabetes Maternal Aunt   . Diabetes Maternal Uncle   . Cancer Maternal Grandmother    History  Substance Use Topics  . Smoking status: Never Smoker   . Smokeless tobacco: Never Used  . Alcohol Use: No   OB History   Grav Para Term Preterm Abortions TAB SAB Ect Mult Living   2 2 2       2      Review of Systems  Constitutional: Negative for fever and chills.  HENT: Positive for neck pain and neck stiffness.   Eyes: Negative for pain.  Respiratory: Negative for shortness of breath.    Cardiovascular: Negative for chest pain.  Gastrointestinal: Negative for abdominal pain.  Genitourinary: Negative for dysuria.  Musculoskeletal: Negative for back pain.  Skin: Negative for rash.  Neurological: Negative for weakness, numbness and headaches.  All other systems reviewed and are negative.    Allergies  Review of patient's allergies indicates no known allergies.  Home Medications   Current Outpatient Rx  Name  Route  Sig  Dispense  Refill  . hydrochlorothiazide (HYDRODIURIL) 25 MG tablet   Oral   Take 25 mg by mouth every morning.          Marland Kitchen HYDROcodone-acetaminophen (NORCO/VICODIN) 5-325 MG per tablet   Oral   Take 1-2 tablets by mouth every 6 (six) hours as needed for pain.         Marland Kitchen ibuprofen (ADVIL,MOTRIN) 800 MG tablet   Oral   Take 1 tablet (800 mg total) by mouth every 8 (eight) hours as needed for pain.   30 tablet   0    BP 139/92  Pulse 92  Temp(Src) 99.3 F (37.4 C) (Oral)  Resp 19  Ht 5\' 2"  (1.575 m)  Wt 238 lb (107.956 kg)  BMI 43.52 kg/m2  SpO2 98%  LMP 08/21/2012  Breastfeeding? No Physical Exam  Constitutional: She is oriented to person, place, and time. She appears  well-developed and well-nourished.  HENT:  Head: Normocephalic and atraumatic.  No trismus or facial swelling/ tenderness  Eyes: EOM are normal. Pupils are equal, round, and reactive to light.  Neck:  Decreased range of motion with lateral rotation of the neck. Tender over left lateral soft tissues with palpable muscle spasm. No associated upper extremity deficits with equal grips, triceps and biceps and sensation to light touch throughout.  Cardiovascular: Normal rate, regular rhythm and intact distal pulses.   Pulmonary/Chest: Effort normal and breath sounds normal. No stridor. No respiratory distress. She exhibits no tenderness.  Abdominal: Soft. There is no tenderness.  Musculoskeletal: Normal range of motion. She exhibits no edema.  Lymphadenopathy:    She has  no cervical adenopathy.  Neurological: She is alert and oriented to person, place, and time.  Skin: Skin is warm and dry.    ED Course   Procedures (including critical care time)  Labs Reviewed - No data to display Dg Cervical Spine Complete  08/27/2012   *RADIOLOGY REPORT*  Clinical Data: Left sided neck pain for 6 days.  No known injury. Limited range of motion.  Headaches.  CERVICAL SPINE - COMPLETE 4+ VIEW  Comparison: 01/26/2012  Findings: There is reversal of the usual cervical lordosis which is nonspecific but could be due to muscle spasm or ligamentous injury. This is more prominent on the previous study.  No abnormal anterior subluxation.  Normal alignment of the facet joints.  Lateral masses of C1 appear symmetrical.  The odontoid process appears intact.  No vertebral compression deformities.  Intervertebral disc space heights are preserved.  Ligamentous calcification anteriorly at C6- 7 is stable.  No prevertebral soft tissue swelling.  No focal bone lesion or bone destruction.  IMPRESSION: Increased reversal of the usual cervical lordosis since previous study is nonspecific but can be associated with muscle spasm or ligamentous injury.  No displaced fractures are identified.   Original Report Authenticated By: Burman Nieves, M.D.    Date: 08/27/2012  Rate: 75  Rhythm: normal sinus rhythm  QRS Axis: normal  Intervals: normal  ST/T Wave abnormalities: nonspecific ST changes  Conduction Disutrbances:none  Narrative Interpretation:   Old EKG Reviewed: unchanged  Motrin and flexeril provided  X-ray results reviewed with patient. Plan discharge home with prescriptions and precautions provided. Orthopedic referral as needed.   MDM  Neck pain and stiffness, exam consistent with torticollis  Screening EKG reviewed as above  Pain medications provided  X-ray obtained and reviewed as above - cervical straightening consistent with muscle spasm  Vital signs / nurse's notes  reviewed and considered   Sunnie Nielsen, MD 08/27/12 [phone]

## 2012-09-01 ENCOUNTER — Encounter (HOSPITAL_COMMUNITY): Payer: Self-pay | Admitting: Emergency Medicine

## 2012-09-01 ENCOUNTER — Emergency Department (HOSPITAL_COMMUNITY)
Admission: EM | Admit: 2012-09-01 | Discharge: 2012-09-01 | Disposition: A | Discharge status: Home / Self Care | Payer: Medicaid Other | Attending: Emergency Medicine | Admitting: Emergency Medicine

## 2012-09-01 DIAGNOSIS — Z87440 Personal history of urinary (tract) infections: Secondary | ICD-10-CM | POA: Insufficient documentation

## 2012-09-01 DIAGNOSIS — Z79899 Other long term (current) drug therapy: Secondary | ICD-10-CM | POA: Insufficient documentation

## 2012-09-01 DIAGNOSIS — F411 Generalized anxiety disorder: Secondary | ICD-10-CM | POA: Insufficient documentation

## 2012-09-01 DIAGNOSIS — I10 Essential (primary) hypertension: Secondary | ICD-10-CM | POA: Insufficient documentation

## 2012-09-01 DIAGNOSIS — K649 Unspecified hemorrhoids: Secondary | ICD-10-CM | POA: Insufficient documentation

## 2012-09-01 MED ORDER — POLYETHYLENE GLYCOL 3350 17 GM/SCOOP PO POWD: 17.0000 g | Freq: Every day | ORAL | Status: DC

## 2012-09-01 NOTE — ED Notes (Signed)
Pt states she is having rectal pain and when she wipes she has noticed a little blood  Pt states she thinks she may have hemorrhoids

## 2012-09-01 NOTE — ED Provider Notes (Signed)
Medical screening examination/treatment/procedure(s) were performed by non-physician practitioner and as supervising physician I was immediately available for consultation/collaboration.  Lyanne Co, MD 09/01/12 [phone]

## 2012-09-01 NOTE — ED Provider Notes (Signed)
CSN: [id number]     Arrival date & time 09/01/12  0052 History     First MD Initiated Contact with Patient 09/01/12 0148     Chief Complaint  Patient presents with  . Rectal Pain   HPI  History provided by the patient. Patient is a 32 year old female with history of hypertension who presents with concerns for possible hemorrhoid. Patient reported noticing a small amount of blood on her tissue paper with the BM last evening. She had also been having some burning and discomfort to her rectal area. She looked into a mirror and saw small lump near her anus. She was concerned about this and came for further evaluation. She reports discomfort is mild to moderate. She did not use any treatments for the symptoms. She denies having similar symptoms previously. Pain and discomfort are worse during the BM. No other aggravating or alleviating factors. Denies any other associated symptoms.    Past Medical History  Diagnosis Date  . Urinary tract infection   . Hypertension   . Headache(784.0)   . Anxiety    Past Surgical History  Procedure Laterality Date  . Cesarean section    . Breast reduction surgery  2000  . Cesarean section  07/03/2011    Procedure: CESAREAN SECTION;  Surgeon: Bing Plume, MD;  Location: [facility];  Service: Gynecology;  Laterality: N/A;  Repeat Cesarean Section Delivery Boy @ 0102, Apgars 9/9   Family History  Problem Relation Age of Onset  . Anesthesia problems Neg Hx   . Hypotension Neg Hx   . Malignant hyperthermia Neg Hx   . Pseudochol deficiency Neg Hx   . Diabetes Maternal Aunt   . Diabetes Maternal Uncle   . Cancer Maternal Grandmother    History  Substance Use Topics  . Smoking status: Never Smoker   . Smokeless tobacco: Never Used  . Alcohol Use: No   OB History   Grav Para Term Preterm Abortions TAB SAB Ect Mult Living   2 2 2       2      Review of Systems  Gastrointestinal: Positive for rectal pain. Negative for abdominal pain, diarrhea,  constipation and blood in stool.  All other systems reviewed and are negative.    Allergies  Review of patient's allergies indicates no known allergies.  Home Medications   Current Outpatient Rx  Name  Route  Sig  Dispense  Refill  . diazepam (VALIUM) 5 MG tablet   Oral   Take 1 tablet (5 mg total) by mouth 2 (two) times daily.   10 tablet   0   . hydrochlorothiazide (HYDRODIURIL) 25 MG tablet   Oral   Take 25 mg by mouth every morning.          Marland Kitchen ibuprofen (ADVIL,MOTRIN) 800 MG tablet   Oral   Take 1 tablet (800 mg total) by mouth every 8 (eight) hours as needed for pain.   30 tablet   0   . traMADol (ULTRAM) 50 MG tablet   Oral   Take 1 tablet (50 mg total) by mouth every 6 (six) hours as needed for pain.   15 tablet   0   . ibuprofen (ADVIL,MOTRIN) 800 MG tablet   Oral   Take 1 tablet (800 mg total) by mouth 3 (three) times daily.   21 tablet   0   . polyethylene glycol powder (GLYCOLAX/MIRALAX) powder   Oral   Take 17 g by mouth daily.   255  g   0    BP 134/87  Pulse 105  Temp(Src) 98.7 F (37.1 C) (Oral)  Resp 18  SpO2 97%  LMP 08/21/2012 Physical Exam  Nursing note and vitals reviewed. Constitutional: She is oriented to person, place, and time. She appears well-developed and well-nourished. No distress.  HENT:  Head: Normocephalic.  Cardiovascular: Normal rate and regular rhythm.   Pulmonary/Chest: Effort normal and breath sounds normal. No respiratory distress. She has no wheezes. She has no rales.  Abdominal: Soft. There is no tenderness.  Genitourinary:     Chaperone was present. Mildly tender right-sided hemorrhoid without any current bleeding. No fissures.  Neurological: She is alert and oriented to person, place, and time.  Skin: Skin is warm and dry. No rash noted.  Psychiatric: She has a normal mood and affect. Her behavior is normal.    ED Course   Procedures   1. Hemorrhoid     MDM  Seen and evaluated. Patient  well-appearing in no acute distress. Very minimal tenderness or discomfort from hemorrhoids at this time. Will recommend conservative treatments with sitz baths, stool softener.  Angus Seller, PA-C 09/01/12 [phone]

## 2012-10-11 ENCOUNTER — Emergency Department (HOSPITAL_COMMUNITY): Payer: Medicaid Other

## 2012-10-11 ENCOUNTER — Emergency Department (HOSPITAL_COMMUNITY)
Admission: EM | Admit: 2012-10-11 | Discharge: 2012-10-11 | Disposition: A | Discharge status: Home / Self Care | Payer: Medicaid Other | Attending: Emergency Medicine | Admitting: Emergency Medicine

## 2012-10-11 ENCOUNTER — Encounter (HOSPITAL_COMMUNITY): Payer: Self-pay | Admitting: *Deleted

## 2012-10-11 DIAGNOSIS — Z3202 Encounter for pregnancy test, result negative: Secondary | ICD-10-CM | POA: Insufficient documentation

## 2012-10-11 DIAGNOSIS — R002 Palpitations: Secondary | ICD-10-CM | POA: Insufficient documentation

## 2012-10-11 DIAGNOSIS — I10 Essential (primary) hypertension: Secondary | ICD-10-CM | POA: Insufficient documentation

## 2012-10-11 DIAGNOSIS — Z79899 Other long term (current) drug therapy: Secondary | ICD-10-CM | POA: Insufficient documentation

## 2012-10-11 DIAGNOSIS — Z87440 Personal history of urinary (tract) infections: Secondary | ICD-10-CM | POA: Insufficient documentation

## 2012-10-11 DIAGNOSIS — Z8659 Personal history of other mental and behavioral disorders: Secondary | ICD-10-CM | POA: Insufficient documentation

## 2012-10-11 LAB — POCT I-STAT, CHEM 8
Chloride: 101 mEq/L (ref 96–112)
Creatinine, Ser: 0.8 mg/dL (ref 0.50–1.10)
Glucose, Bld: 108 mg/dL — ABNORMAL HIGH (ref 70–99)
HCT: 48 % — ABNORMAL HIGH (ref 36.0–46.0)
Potassium: 3.7 mEq/L (ref 3.5–5.1)
Sodium: 138 mEq/L (ref 135–145)

## 2012-10-11 NOTE — ED Notes (Signed)
Pt reports having an irregular heart beat x 2 days and SOB related to that . Denies chest pain, dizziness.

## 2012-10-11 NOTE — ED Notes (Signed)
MD at bedside. 

## 2012-10-11 NOTE — ED Provider Notes (Signed)
CSN: [id number]     Arrival date & time 10/11/12  0827 History   First MD Initiated Contact with Patient 10/11/12 0845     No chief complaint on file.  (Consider location/radiation/quality/duration/timing/severity/associated sxs/prior Treatment) Patient is a 32 y.o. female presenting with palpitations. The history is provided by the patient.  Palpitations Palpitations quality:  Irregular Onset quality:  Sudden Timing:  Sporadic Progression:  Unchanged Chronicity:  New Context: anxiety   Context: not appetite suppressants, not bronchodilators, not caffeine, not hyperventilation, not illicit drugs, not nicotine and not stimulant use   Context comment:  Was using a cough medicine last week Relieved by:  Nothing Worsened by:  Nothing tried Ineffective treatments:  None tried Associated symptoms: no chest pain, no chest pressure, no cough, no diaphoresis, no dizziness, no lower extremity edema, no nausea and no near-syncope   Associated symptoms comment:  When this occurs she has to take a deep breath Risk factors: OTC sinus medications   Risk factors: no diabetes mellitus, no heart disease, no hx of atrial fibrillation, no hx of DVT and no hx of PE     Past Medical History  Diagnosis Date  . Urinary tract infection   . Hypertension   . Headache(784.0)   . Anxiety    Past Surgical History  Procedure Laterality Date  . Cesarean section    . Breast reduction surgery  2000  . Cesarean section  07/03/2011    Procedure: CESAREAN SECTION;  Surgeon: Bing Plume, MD;  Location: [facility];  Service: Gynecology;  Laterality: N/A;  Repeat Cesarean Section Delivery Boy @ 0102, Apgars 9/9   Family History  Problem Relation Age of Onset  . Anesthesia problems Neg Hx   . Hypotension Neg Hx   . Malignant hyperthermia Neg Hx   . Pseudochol deficiency Neg Hx   . Diabetes Maternal Aunt   . Diabetes Maternal Uncle   . Cancer Maternal Grandmother    History  Substance Use Topics  . Smoking  status: Never Smoker   . Smokeless tobacco: Never Used  . Alcohol Use: No   OB History   Grav Para Term Preterm Abortions TAB SAB Ect Mult Living   2 2 2       2      Review of Systems  Constitutional: Negative for diaphoresis.  Respiratory: Negative for cough.   Cardiovascular: Positive for palpitations. Negative for chest pain and near-syncope.  Gastrointestinal: Negative for nausea.  Neurological: Negative for dizziness.  All other systems reviewed and are negative.    Allergies  Review of patient's allergies indicates no known allergies.  Home Medications   Current Outpatient Rx  Name  Route  Sig  Dispense  Refill  . hydrochlorothiazide (HYDRODIURIL) 25 MG tablet   Oral   Take 25 mg by mouth every morning.          Marland Kitchen ibuprofen (ADVIL,MOTRIN) 800 MG tablet   Oral   Take 1 tablet (800 mg total) by mouth 3 (three) times daily.   21 tablet   0   . pseudoephedrine (SINUS & ALLERGY 12 HOUR) 120 MG 12 hr tablet   Oral   Take 120 mg by mouth every 12 (twelve) hours.         . traMADol (ULTRAM) 50 MG tablet   Oral   Take 1 tablet (50 mg total) by mouth every 6 (six) hours as needed for pain.   15 tablet   0    BP 134/86  Pulse  88  Temp(Src) 98.5 F (36.9 C) (Oral)  Resp 13  SpO2 99%  LMP 09/01/2012 Physical Exam  Nursing note and vitals reviewed. Constitutional: She is oriented to person, place, and time. She appears well-developed and well-nourished. No distress.  HENT:  Head: Normocephalic and atraumatic.  Mouth/Throat: Oropharynx is clear and moist.  Eyes: Conjunctivae and EOM are normal. Pupils are equal, round, and reactive to light.  Neck: Normal range of motion. Neck supple.  Cardiovascular: Normal rate, regular rhythm and intact distal pulses.   No murmur heard. Pulmonary/Chest: Effort normal and breath sounds normal. No respiratory distress. She has no wheezes. She has no rales.  Abdominal: Soft. She exhibits no distension. There is no  tenderness. There is no rebound and no guarding.  Musculoskeletal: Normal range of motion. She exhibits no edema and no tenderness.  Neurological: She is alert and oriented to person, place, and time.  Skin: Skin is warm and dry. No rash noted. No erythema.  Psychiatric: Her behavior is normal. Her mood appears not anxious.    ED Course  Procedures (including critical care time) Labs Review Labs Reviewed  POCT I-STAT, CHEM 8 - Abnormal; Notable for the following:    Glucose, Bld 108 (*)    Hemoglobin 16.3 (*)    HCT 48.0 (*)    All other components within normal limits  POCT PREGNANCY, URINE   Imaging Review Dg Chest 2 View  10/11/2012   *RADIOLOGY REPORT*  Clinical Data: Heart palpitations, some shortness of breath  CHEST - 2 VIEW  Comparison: CT chest of 07/06/2011 and chest x-ray of 11/09/2005  Findings: No active infiltrate or effusion is seen.  The heart is within normal limits in size.  No bony abnormality is seen.  IMPRESSION: Stable chest x-ray.  No active lung disease.   Original Report Authenticated By: Dwyane Dee, M.D.     Date: 10/11/2012  Rate: 82  Rhythm: normal sinus rhythm  QRS Axis: normal  Intervals: normal  ST/T Wave abnormalities: normal  Conduction Disutrbances:none  Narrative Interpretation:   Old EKG Reviewed: unchanged   MDM  No diagnosis found.  Patient here with symptoms most consistent with possible PVCs. She gets occasional palpitations that resolved in seconds to make her hesitating take a deep breath. She's had no syncope, chest pain, lightheadedness. She states last week she had a sinus infection and was taking over-the-counter medication but has stopped that now. Otherwise none of her medications have changed. She has had any retained and increased stress but states this is not as bad as her prior anxiety.  She is well appearing on exam has a normal EKG here and at this point no new to PVCs however currently she is asymptomatic. She has no prior  history of cardiac conditions for dysrhythmias and no family history. She is not a substance abuser.  PERC neg.  Her periods have been irregular however UPT here is negative. I-STAT with normal electrolytes. Chest x-ray within normal limits.  Feel most likely this is PVCs versus mild anxiety. Reassured patient and discharged her home to follow up with her PCP if symptoms continue.    Gwyneth Sprout, MD 10/11/12 [phone]

## 2012-10-25 ENCOUNTER — Emergency Department (HOSPITAL_COMMUNITY)
Admission: EM | Admit: 2012-10-25 | Discharge: 2012-10-25 | Disposition: A | Discharge status: Home / Self Care | Payer: Medicaid Other | Attending: Emergency Medicine | Admitting: Emergency Medicine

## 2012-10-25 ENCOUNTER — Encounter (HOSPITAL_COMMUNITY): Payer: Self-pay | Admitting: Family Medicine

## 2012-10-25 DIAGNOSIS — Z79899 Other long term (current) drug therapy: Secondary | ICD-10-CM | POA: Insufficient documentation

## 2012-10-25 DIAGNOSIS — Z8669 Personal history of other diseases of the nervous system and sense organs: Secondary | ICD-10-CM | POA: Insufficient documentation

## 2012-10-25 DIAGNOSIS — Z8659 Personal history of other mental and behavioral disorders: Secondary | ICD-10-CM | POA: Insufficient documentation

## 2012-10-25 DIAGNOSIS — Z87440 Personal history of urinary (tract) infections: Secondary | ICD-10-CM | POA: Insufficient documentation

## 2012-10-25 DIAGNOSIS — I10 Essential (primary) hypertension: Secondary | ICD-10-CM | POA: Insufficient documentation

## 2012-10-25 DIAGNOSIS — R002 Palpitations: Secondary | ICD-10-CM

## 2012-10-25 NOTE — ED Notes (Signed)
Pt complaining of a few weeks of intermittent irregular HR. sts associated with some SOB. Denies dizziness, sts some nausea.

## 2012-10-25 NOTE — ED Provider Notes (Signed)
CSN: [id number]     Arrival date & time 10/25/12  0831 History   First MD Initiated Contact with Patient 10/25/12 [phone]     Chief Complaint  Patient presents with  . Irregular Heart Beat   (Consider location/radiation/quality/duration/timing/severity/associated sxs/prior Treatment) HPI Patient was seen earlier this month for the same complaint. She is having periodic, brief episodes of irregular heartbeat. Patient states that she feels as if she is having an extra beat. She notices it more when she is at rest. The episodes only last a few seconds and resolve. She states they're not associated with anxiety or stress though she does become concerned when she has them. She has no chest pain. She states that when she is having the episodes she does have the sensation of having difficulty taking a deep breath in or feeling that she needs to cough but this resolves immediately over the course of a few seconds. She's had no recent travel or surgery. She is not on any oral birth control. She has no lower extremity swelling or pain. She denies caffeine intake or nasal decongestants. Patient is symptom-free at this point. Past Medical History  Diagnosis Date  . Urinary tract infection   . Hypertension   . Headache(784.0)   . Anxiety    Past Surgical History  Procedure Laterality Date  . Cesarean section    . Breast reduction surgery  2000  . Cesarean section  07/03/2011    Procedure: CESAREAN SECTION;  Surgeon: Bing Plume, MD;  Location: [facility];  Service: Gynecology;  Laterality: N/A;  Repeat Cesarean Section Delivery Boy @ 0102, Apgars 9/9   Family History  Problem Relation Age of Onset  . Anesthesia problems Neg Hx   . Hypotension Neg Hx   . Malignant hyperthermia Neg Hx   . Pseudochol deficiency Neg Hx   . Diabetes Maternal Aunt   . Diabetes Maternal Uncle   . Cancer Maternal Grandmother    History  Substance Use Topics  . Smoking status: Never Smoker   . Smokeless tobacco: Never Used   . Alcohol Use: No   OB History   Grav Para Term Preterm Abortions TAB SAB Ect Mult Living   2 2 2       2      Review of Systems  Constitutional: Negative for fever and chills.  Respiratory: Negative for cough and shortness of breath.   Cardiovascular: Positive for palpitations. Negative for chest pain and leg swelling.  Gastrointestinal: Negative for nausea, vomiting and abdominal pain.  Musculoskeletal: Negative for back pain.  Skin: Negative for rash and wound.  Neurological: Negative for dizziness, weakness, light-headedness, numbness and headaches.  All other systems reviewed and are negative.    Allergies  Review of patient's allergies indicates no known allergies.  Home Medications   Current Outpatient Rx  Name  Route  Sig  Dispense  Refill  . hydrochlorothiazide (HYDRODIURIL) 25 MG tablet   Oral   Take 25 mg by mouth every morning.          Marland Kitchen ibuprofen (ADVIL,MOTRIN) 800 MG tablet   Oral   Take 1 tablet (800 mg total) by mouth 3 (three) times daily.   21 tablet   0   . pseudoephedrine (SINUS & ALLERGY 12 HOUR) 120 MG 12 hr tablet   Oral   Take 120 mg by mouth every 12 (twelve) hours.         . traMADol (ULTRAM) 50 MG tablet   Oral  Take 1 tablet (50 mg total) by mouth every 6 (six) hours as needed for pain.   15 tablet   0    BP 143/89  Pulse 90  Temp(Src) 97.8 F (36.6 C) (Oral)  Resp 18  SpO2 97%  LMP 09/01/2012 Physical Exam  Nursing note and vitals reviewed. Constitutional: She is oriented to person, place, and time. She appears well-developed and well-nourished. No distress.  HENT:  Head: Normocephalic and atraumatic.  Mouth/Throat: Oropharynx is clear and moist.  Eyes: EOM are normal. Pupils are equal, round, and reactive to light.  Neck: Normal range of motion. Neck supple. No thyromegaly present.  Cardiovascular: Normal rate and regular rhythm.   Pulmonary/Chest: Effort normal and breath sounds normal. No respiratory distress. She  has no wheezes. She has no rales.  Abdominal: Soft. Bowel sounds are normal. She exhibits no distension and no mass. There is no tenderness. There is no rebound and no guarding.  Musculoskeletal: Normal range of motion. She exhibits no edema and no tenderness.  No calf swelling or pain.  Neurological: She is alert and oriented to person, place, and time.  Patient is alert and oriented x3 with clear, goal oriented speech. Patient has 5/5 motor in all extremities. Sensation is intact to light touch. Patient has a normal gait and walks without assistance.   Skin: Skin is warm and dry. No rash noted. No erythema.  Psychiatric: She has a normal mood and affect. Her behavior is normal.    ED Course  Procedures (including critical care time) Labs Review Labs Reviewed - No data to display Imaging Review No results found.  Date: 10/25/2012  Rate: 91  Rhythm: normal sinus rhythm  QRS Axis: normal  Intervals: normal  ST/T Wave abnormalities: normal  Conduction Disutrbances:none  Narrative Interpretation:   Old EKG Reviewed: unchanged   MDM   1. Heart palpitations    Patient has a normal EKG, normal vital signs and normal physical exam. Her symptoms sound like a combination of possible PVCs and anxiety. Given this is her second visit to the emergency department this month for similar symptoms, I suggested that she followup with her cardiologist for possible Holter monitoring. Patient is agreeable with plan. Do not believe further workup is needed at this time. I reviewed her labs and x-ray from when she was here earlier this month. She's PERC negative. I do not believe workup for PE given her normal vital signs and atypical symptoms is warranted at this time.     Loren Racer, MD 10/25/12 [phone]

## 2012-11-07 ENCOUNTER — Emergency Department (HOSPITAL_COMMUNITY)
Admission: EM | Admit: 2012-11-07 | Discharge: 2012-11-07 | Disposition: A | Discharge status: Home / Self Care | Payer: Medicaid Other | Attending: Emergency Medicine | Admitting: Emergency Medicine

## 2012-11-07 ENCOUNTER — Encounter (HOSPITAL_COMMUNITY): Payer: Self-pay | Admitting: *Deleted

## 2012-11-07 DIAGNOSIS — R05 Cough: Secondary | ICD-10-CM | POA: Insufficient documentation

## 2012-11-07 DIAGNOSIS — Z79899 Other long term (current) drug therapy: Secondary | ICD-10-CM | POA: Insufficient documentation

## 2012-11-07 DIAGNOSIS — R059 Cough, unspecified: Secondary | ICD-10-CM | POA: Insufficient documentation

## 2012-11-07 DIAGNOSIS — R040 Epistaxis: Secondary | ICD-10-CM

## 2012-11-07 DIAGNOSIS — Z8659 Personal history of other mental and behavioral disorders: Secondary | ICD-10-CM | POA: Insufficient documentation

## 2012-11-07 DIAGNOSIS — I10 Essential (primary) hypertension: Secondary | ICD-10-CM | POA: Insufficient documentation

## 2012-11-07 DIAGNOSIS — Z87440 Personal history of urinary (tract) infections: Secondary | ICD-10-CM | POA: Insufficient documentation

## 2012-11-07 MED ORDER — OXYMETAZOLINE HCL 0.05 % NA SOLN: 2.0000 | Freq: Two times a day (BID) | NASAL | Status: DC

## 2012-11-07 MED ORDER — OXYMETAZOLINE HCL 0.05 % NA SOLN
1.0000 | Freq: Once | NASAL | Status: AC
  Administered 2012-11-07: 1 via NASAL
  Filled 2012-11-07: qty 15

## 2012-11-07 NOTE — ED Notes (Signed)
Pt c/o nosebleed during the night; no active bleeding now

## 2012-11-07 NOTE — ED Provider Notes (Addendum)
CSN: [id number]     Arrival date & time 11/07/12  0610 History   First MD Initiated Contact with Patient 11/07/12 [phone]     Chief Complaint  Patient presents with  . Epistaxis   (Consider location/radiation/quality/duration/timing/severity/associated sxs/prior Treatment) HPI  This is a 32 year old female with history of hypertension who presents with epistaxis.  The patient states that she woke up this morning and noted a small amount of blood on her pillow.  She noted that her right neuritis actively bleeding. She also had several episodes of coughing up small amount of blood. The nosebleed and coughing improved in route here. No prior history of nosebleeds.  She has no other complaints. She denies any fevers, chest pain, shortness of breath, abdominal pain, urinary symptoms, focal weakness or numbness. Past Medical History  Diagnosis Date  . Urinary tract infection   . Hypertension   . Headache(784.0)   . Anxiety    Past Surgical History  Procedure Laterality Date  . Cesarean section    . Breast reduction surgery  2000  . Cesarean section  07/03/2011    Procedure: CESAREAN SECTION;  Surgeon: Bing Plume, MD;  Location: [facility];  Service: Gynecology;  Laterality: N/A;  Repeat Cesarean Section Delivery Boy @ 0102, Apgars 9/9   Family History  Problem Relation Age of Onset  . Anesthesia problems Neg Hx   . Hypotension Neg Hx   . Malignant hyperthermia Neg Hx   . Pseudochol deficiency Neg Hx   . Diabetes Maternal Aunt   . Diabetes Maternal Uncle   . Cancer Maternal Grandmother    History  Substance Use Topics  . Smoking status: Never Smoker   . Smokeless tobacco: Never Used  . Alcohol Use: No   OB History   Grav Para Term Preterm Abortions TAB SAB Ect Mult Living   2 2 2       2      Review of Systems  Constitutional: Negative for fever.  HENT: Positive for nosebleeds.   Respiratory: Positive for cough. Negative for chest tightness and shortness of breath.    Cardiovascular: Negative for chest pain.  Gastrointestinal: Negative for nausea, vomiting and abdominal pain.  Genitourinary: Negative for dysuria.  Musculoskeletal: Negative for back pain.  Neurological: Negative for headaches.  All other systems reviewed and are negative.    Allergies  Review of patient's allergies indicates no known allergies.  Home Medications   Current Outpatient Rx  Name  Route  Sig  Dispense  Refill  . hydrochlorothiazide (HYDRODIURIL) 25 MG tablet   Oral   Take 25 mg by mouth every morning.          Marland Kitchen ibuprofen (ADVIL,MOTRIN) 800 MG tablet   Oral   Take 1 tablet (800 mg total) by mouth 3 (three) times daily.   21 tablet   0   . oxymetazoline (AFRIN NASAL SPRAY) 0.05 % nasal spray   Nasal   Place 2 sprays into the nose 2 (two) times daily.   30 mL   0    BP 149/90  Pulse 87  Temp(Src) 98.4 F (36.9 C) (Oral)  Resp 18  SpO2 97%  LMP 10/24/2012 Physical Exam  Nursing note and vitals reviewed. Constitutional: She is oriented to person, place, and time. She appears well-developed and well-nourished.  HENT:  Head: Normocephalic and atraumatic.  Dry mucous membranes, no active bleeding noted from the bilateral nares, small amount of friable tissue noted on the right nasal septum  Eyes: Pupils  are equal, round, and reactive to light.  Neck: Neck supple.  Cardiovascular: Normal rate, regular rhythm and normal heart sounds.   Pulmonary/Chest: Effort normal and breath sounds normal. No respiratory distress. She has no wheezes.  Abdominal: Soft. Bowel sounds are normal. There is no tenderness.  Musculoskeletal: She exhibits no edema.  Neurological: She is alert and oriented to person, place, and time.  Skin: Skin is warm and dry.  Psychiatric: She has a normal mood and affect.    ED Course  Procedures (including critical care time) Labs Review Labs Reviewed - No data to display Imaging Review No results found.  MDM   1. Epistaxis     This a 32 year old female who presents with epistaxis. The patient is nontoxic-appearing on exam and her vital signs are within normal limits. She has no evidence of active bleeding right now. Her cough productive of small amounts of blood was likely secondary to her nosebleed. I explained this to the patient.  While there is a small area of friable tissue over the right nasal septum, patient can likely be treated conservatively with Afrin nasal spray. I discussed with the patient the importance to keep her nares moist especially this time of year. She was instructed on how to manage acute nosebleeds. Review of patient's lab work from June shows a normal platelet count. At this time and do not feel she needs further workup. She will be sent home with Afrin nasal spray.    Shon Baton, MD 11/07/12 1610  Shon Baton, MD 11/07/12 [phone]

## 2012-11-27 ENCOUNTER — Ambulatory Visit (INDEPENDENT_AMBULATORY_CARE_PROVIDER_SITE_OTHER): Payer: Medicaid Other | Admitting: Cardiology

## 2012-11-27 ENCOUNTER — Encounter: Payer: Self-pay | Admitting: Cardiology

## 2012-11-27 VITALS — BP 136/94 | HR 94 | Ht 62.0 in | Wt 246.1 lb

## 2012-11-27 DIAGNOSIS — I10 Essential (primary) hypertension: Secondary | ICD-10-CM

## 2012-11-27 NOTE — Patient Instructions (Signed)
**Note De-identified Riyansh Gerstner Obfuscation** Your physician recommends that you continue on your current medications as directed. Please refer to the Current Medication list given to you today.  Your physician recommends that you schedule a follow-up appointment in: as needed  

## 2012-11-27 NOTE — Progress Notes (Signed)
Patient ID: Wynetta Emery, female   DOB: 1981-01-26, 32 y.o.   MRN: [id number]    Patient Name: Jamie Hays Date of Encounter: 11/27/2012  Primary Care Provider:  No primary provider on file. Primary Cardiologist:  Tobias Alexander, H   Patient Profile  Palpitations  Problem List   Past Medical History  Diagnosis Date  . Urinary tract infection   . Hypertension   . Headache(784.0)   . Anxiety    Past Surgical History  Procedure Laterality Date  . Cesarean section    . Breast reduction surgery  2000  . Cesarean section  07/03/2011    Procedure: CESAREAN SECTION;  Surgeon: Bing Plume, MD;  Location: [facility];  Service: Gynecology;  Laterality: N/A;  Repeat Cesarean Section Delivery Boy @ 0102, Apgars 9/9    Allergies  No Known Allergies  HPI  32 year old female with prior medical history of hypertension who was seen in the ER twice recently for palpitations. She is having periodic, brief episodes of irregular heartbeat. Patient states that she feels as if she is having an extra beat. She notices it more when she is at rest. The episodes only last a few seconds and resolve. She states they're not associated with anxiety or stress though she does become concerned when she has them. She has no chest pain. The patient denies any associated symptoms like shortness of breath, dizziness, no prior history of syncope. Patient states that these episodes really feel just like in one extra heartbeat but they make her very stressed. The patient has those episodes approximately every other week. There is no family history of coronary artery disease.  Home Medications  Prior to Admission medications   Medication Sig Start Date End Date Taking? Authorizing Provider  hydrochlorothiazide (HYDRODIURIL) 25 MG tablet Take 25 mg by mouth every morning.     Historical Provider, MD  ibuprofen (ADVIL,MOTRIN) 800 MG tablet Take 1 tablet (800 mg total) by mouth 3 (three) times daily. 08/27/12    Sunnie Nielsen, MD  oxymetazoline (AFRIN NASAL SPRAY) 0.05 % nasal spray Place 2 sprays into the nose 2 (two) times daily. 11/07/12   Shon Baton, MD    Family History  Family History  Problem Relation Age of Onset  . Anesthesia problems Neg Hx   . Hypotension Neg Hx   . Malignant hyperthermia Neg Hx   . Pseudochol deficiency Neg Hx   . Diabetes Maternal Aunt   . Diabetes Maternal Uncle   . Cancer Maternal Grandmother     Social History  History   Social History  . Marital Status: Single    Spouse Name: N/A    Number of Children: N/A  . Years of Education: N/A   Occupational History  . Not on file.   Social History Main Topics  . Smoking status: Never Smoker   . Smokeless tobacco: Never Used  . Alcohol Use: No  . Drug Use: No  . Sexual Activity: Yes    Birth Control/ Protection: None, Pill   Other Topics Concern  . Not on file   Social History Narrative  . No narrative on file     Review of Systems General:  No chills, fever, night sweats or weight changes.  Cardiovascular:  No chest pain, dyspnea on exertion, edema, orthopnea, palpitations, paroxysmal nocturnal dyspnea. Dermatological: No rash, lesions/masses Respiratory: No cough, dyspnea Urologic: No hematuria, dysuria Abdominal:   No nausea, vomiting, diarrhea, bright red blood per rectum, melena, or hematemesis Neurologic:  No visual changes, wkns, changes in mental status. All other systems reviewed and are otherwise negative except as noted above.  Physical Exam  Last menstrual period 10/24/2012.  General: Pleasant, NAD Psych: Normal affect. Neuro: Alert and oriented X 3. Moves all extremities spontaneously. HEENT: Normal  Neck: Supple without bruits or JVD. Lungs:  Resp regular and unlabored, CTA. Heart: RRR no s3, s4, or murmurs. Abdomen: Soft, non-tender, non-distended, BS + x 4.  Extremities: No clubbing, cyanosis or edema. DP/PT/Radials 2+ and equal bilaterally.  Accessory Clinical  Findings  ECG - normal sinus rhythm 94 beats per minute normal ECG  Assessment & Plan  Number pleasant 32 year old female who is coming with concern palpitations. The palpitations only happen every other week and last for a for a second or few seconds. These are more probably just PVCs. The patient has no associated symptoms no prior history of dizziness or syncope. There is currently no further workup necessary. Her recent TSH was normal. The patient was reassured that this is a benign arrhythmia and in case these increased in frequency or length she should contact us. We discussed the possibility of starting taking beta blocker however the patient feels okay just with reassurance.   Tobias Alexander, Rexene Edison, MD 11/27/2012, 10:07 AM

## 2012-12-18 ENCOUNTER — Emergency Department (HOSPITAL_COMMUNITY)
Admission: EM | Admit: 2012-12-18 | Discharge: 2012-12-18 | Disposition: A | Discharge status: Home / Self Care | Payer: Medicaid Other | Attending: Emergency Medicine | Admitting: Emergency Medicine

## 2012-12-18 ENCOUNTER — Encounter (HOSPITAL_COMMUNITY): Payer: Self-pay | Admitting: Emergency Medicine

## 2012-12-18 DIAGNOSIS — F419 Anxiety disorder, unspecified: Secondary | ICD-10-CM

## 2012-12-18 DIAGNOSIS — Z87440 Personal history of urinary (tract) infections: Secondary | ICD-10-CM | POA: Insufficient documentation

## 2012-12-18 DIAGNOSIS — F411 Generalized anxiety disorder: Secondary | ICD-10-CM | POA: Insufficient documentation

## 2012-12-18 DIAGNOSIS — Z79899 Other long term (current) drug therapy: Secondary | ICD-10-CM | POA: Insufficient documentation

## 2012-12-18 DIAGNOSIS — I10 Essential (primary) hypertension: Secondary | ICD-10-CM | POA: Insufficient documentation

## 2012-12-18 MED ORDER — LORAZEPAM 1 MG PO TABS: 1.0000 mg | ORAL_TABLET | Freq: Every evening | ORAL | Status: DC | PRN

## 2012-12-18 NOTE — ED Provider Notes (Addendum)
CSN: [id number]     Arrival date & time 12/18/12  0150 History   First MD Initiated Contact with Patient 12/18/12 0422     Chief Complaint  Patient presents with  . Anxiety   (Consider location/radiation/quality/duration/timing/severity/associated sxs/prior Treatment) HPI Is a 32 year old female who has had what she describes as anxiety attacks for the past 3 nights. They occur during sleep and wake her up. She has a history of similar attacks in the past that were relieved with an anxiolytic. They are not severe but are frustrating to her. They're characterized as feeling her body shaking and feeling anxious. She is not aware of any trigger. She has also had some mild crampy pain in her left shin after wearing some poorly supportive shoes 4 days ago.  Past Medical History  Diagnosis Date  . Urinary tract infection   . Hypertension   . Headache(784.0)   . Anxiety    Past Surgical History  Procedure Laterality Date  . Cesarean section    . Breast reduction surgery  2000  . Cesarean section  07/03/2011    Procedure: CESAREAN SECTION;  Surgeon: Bing Plume, MD;  Location: [facility];  Service: Gynecology;  Laterality: N/A;  Repeat Cesarean Section Delivery Boy @ 0102, Apgars 9/9   Family History  Problem Relation Age of Onset  . Anesthesia problems Neg Hx   . Hypotension Neg Hx   . Malignant hyperthermia Neg Hx   . Pseudochol deficiency Neg Hx   . Diabetes Maternal Aunt   . Diabetes Maternal Uncle   . Cancer Maternal Grandmother    History  Substance Use Topics  . Smoking status: Never Smoker   . Smokeless tobacco: Never Used  . Alcohol Use: No   OB History   Grav Para Term Preterm Abortions TAB SAB Ect Mult Living   2 2 2       2      Review of Systems  All other systems reviewed and are negative.    Allergies  Review of patient's allergies indicates no known allergies.  Home Medications   Current Outpatient Rx  Name  Route  Sig  Dispense  Refill  . amLODipine  (NORVASC) 5 MG tablet   Oral   Take 5 mg by mouth every evening.         . hydrochlorothiazide (HYDRODIURIL) 25 MG tablet   Oral   Take 12.5 mg by mouth every morning.           BP 143/87  Pulse 90  Temp(Src) 98.3 F (36.8 C) (Oral)  Resp 20  SpO2 98%  LMP 11/26/2012  Physical Exam General: Well-developed, well-nourished female in no acute distress; appearance consistent with age of record HENT: normocephalic; atraumatic Eyes: pupils equal, round and reactive to light; extraocular muscles intact Neck: supple Heart: regular rate and rhythm Lungs: clear to auscultation bilaterally Abdomen: soft; nondistended Extremities: No deformity; full range of motion; pulses normal; nontender; no edema Neurologic: Awake, alert and oriented; motor function intact in all extremities and symmetric; no facial droop Skin: Warm and dry Psychiatric: Mildly anxious    ED Course  Procedures (including critical care time)   MDM  The patient denies a history of heavy snoring or apnea during sleep. She has never been told that she exhibited these symptoms during sleep.    Hanley Seamen, MD 12/18/12 4098  Hanley Seamen, MD 12/18/12 1191  Hanley Seamen, MD 12/18/12 [phone]

## 2012-12-18 NOTE — ED Notes (Signed)
Per Pt, has had anxiety and felt tremors since Thursday.  Feels body shaking.  Pt also c/o leg pain.  Leg pain started Wednesday night.  More to left side.  No trauma noted.

## 2013-01-03 ENCOUNTER — Other Ambulatory Visit: Payer: Self-pay | Admitting: Family Medicine

## 2013-01-03 ENCOUNTER — Other Ambulatory Visit (HOSPITAL_COMMUNITY)
Admission: RE | Admit: 2013-01-03 | Discharge: 2013-01-03 | Disposition: A | Discharge status: Home / Self Care | Payer: Medicaid Other | Source: Ambulatory Visit | Attending: Family Medicine | Admitting: Family Medicine

## 2013-01-03 DIAGNOSIS — N760 Acute vaginitis: Secondary | ICD-10-CM | POA: Insufficient documentation

## 2013-01-03 DIAGNOSIS — Z113 Encounter for screening for infections with a predominantly sexual mode of transmission: Secondary | ICD-10-CM | POA: Insufficient documentation

## 2013-03-21 ENCOUNTER — Encounter (HOSPITAL_COMMUNITY): Payer: Self-pay | Admitting: Emergency Medicine

## 2013-03-21 ENCOUNTER — Emergency Department (HOSPITAL_COMMUNITY)
Admission: EM | Admit: 2013-03-21 | Discharge: 2013-03-21 | Disposition: A | Discharge status: Home / Self Care | Payer: Medicaid Other | Source: Home / Self Care | Attending: Emergency Medicine | Admitting: Emergency Medicine

## 2013-03-21 DIAGNOSIS — J029 Acute pharyngitis, unspecified: Secondary | ICD-10-CM

## 2013-03-21 LAB — POCT RAPID STREP A: Streptococcus, Group A Screen (Direct): NEGATIVE

## 2013-03-21 NOTE — ED Notes (Signed)
Pt c/o sore throat onset Monday and it hurts to swallow Denies f/v/n/d Alert w/no signs of acute distress.

## 2013-03-21 NOTE — Discharge Instructions (Signed)
Your test for strep throat was negative. Exam without worrisome finding. Vital signs normal. Advise symptomatic care at home with warm salt water gargles and tylenol or ibuprofen as directed on packaging for pain. PCP follow up if no improvement.

## 2013-03-21 NOTE — ED Provider Notes (Signed)
CSN: [id number]     Arrival date & time 03/21/13  1041 History   First MD Initiated Contact with Patient 03/21/13 1057     No chief complaint on file.    (Consider location/radiation/quality/duration/timing/severity/associated sxs/prior Treatment) HPI Comments: Patient presents with 3 day history of sore throat without associated fever, rhinorrhea, cough or post nasal drainage. She expresses concern that she may have strep throat. No known ill contacts.  The history is provided by the patient.    Past Medical History  Diagnosis Date  . Urinary tract infection   . Hypertension   . Headache(784.0)   . Anxiety    Past Surgical History  Procedure Laterality Date  . Cesarean section    . Breast reduction surgery  2000  . Cesarean section  07/03/2011    Procedure: CESAREAN SECTION;  Surgeon: Bing Plume, MD;  Location: [facility];  Service: Gynecology;  Laterality: N/A;  Repeat Cesarean Section Delivery Boy @ 0102, Apgars 9/9   Family History  Problem Relation Age of Onset  . Anesthesia problems Neg Hx   . Hypotension Neg Hx   . Malignant hyperthermia Neg Hx   . Pseudochol deficiency Neg Hx   . Diabetes Maternal Aunt   . Diabetes Maternal Uncle   . Cancer Maternal Grandmother    History  Substance Use Topics  . Smoking status: Never Smoker   . Smokeless tobacco: Never Used  . Alcohol Use: No   OB History   Grav Para Term Preterm Abortions TAB SAB Ect Mult Living   2 2 2       2      Review of Systems  All other systems reviewed and are negative.      Allergies  Review of patient's allergies indicates no known allergies.  Home Medications   Current Outpatient Rx  Name  Route  Sig  Dispense  Refill  . amLODipine (NORVASC) 5 MG tablet   Oral   Take 5 mg by mouth every evening.         . hydrochlorothiazide (HYDRODIURIL) 25 MG tablet   Oral   Take 12.5 mg by mouth every morning.          Marland Kitchen LORazepam (ATIVAN) 1 MG tablet   Oral   Take 1 tablet (1 mg  total) by mouth at bedtime as needed for anxiety or sleep.   10 tablet   0    BP 132/94  Pulse 88  Temp(Src) 98.6 F (37 C) (Oral)  Resp 18  SpO2 96% Physical Exam  Vitals reviewed. Constitutional: She is oriented to person, place, and time. She appears well-developed and well-nourished. No distress.  +obese  HENT:  Head: Normocephalic and atraumatic.  Right Ear: Hearing, tympanic membrane, external ear and ear canal normal.  Left Ear: Hearing, tympanic membrane, external ear and ear canal normal.  Nose: Nose normal.  Mouth/Throat: Uvula is midline, oropharynx is clear and moist and mucous membranes are normal.  Eyes: Conjunctivae are normal. Right eye exhibits no discharge. Left eye exhibits no discharge. No scleral icterus.  Neck: Normal range of motion. Neck supple.  Cardiovascular: Normal rate, regular rhythm and normal heart sounds.   Pulmonary/Chest: Effort normal and breath sounds normal.  Lymphadenopathy:    She has no cervical adenopathy.  Neurological: She is alert and oriented to person, place, and time.  Skin: Skin is warm and dry.  Psychiatric: She has a normal mood and affect. Her behavior is normal.    ED Course  Procedures (including critical care time) Labs Review Labs Reviewed - No data to display Imaging Review No results found.    MDM   Final diagnoses:  None   Exam without worrisome finding. Vital signs normal. Rapid strep screen negative. Will advise symptomatic care at home with warm salt water gargles and tylenol or ibuprofen as directed on packaging for pain. PCP follow up if no improvement.     Jess BartersJennifer Lee CalwaPresson, GeorgiaPA 03/21/13 1125

## 2013-03-22 NOTE — ED Provider Notes (Signed)
Medical screening examination/treatment/procedure(s) were performed by non-physician practitioner and as supervising physician I was immediately available for consultation/collaboration.  Adler Alton, M.D.  Bastion Bolger C Kristene Liberati, MD 03/22/13 0824 

## 2013-03-23 LAB — CULTURE, GROUP A STREP

## 2013-04-22 ENCOUNTER — Encounter (HOSPITAL_COMMUNITY): Payer: Self-pay | Admitting: Emergency Medicine

## 2013-04-22 ENCOUNTER — Emergency Department (HOSPITAL_COMMUNITY)
Admission: EM | Admit: 2013-04-22 | Discharge: 2013-04-23 | Disposition: A | Discharge status: Home / Self Care | Payer: Medicaid Other | Attending: Emergency Medicine | Admitting: Emergency Medicine

## 2013-04-22 DIAGNOSIS — M791 Myalgia, unspecified site: Secondary | ICD-10-CM

## 2013-04-22 DIAGNOSIS — I10 Essential (primary) hypertension: Secondary | ICD-10-CM | POA: Insufficient documentation

## 2013-04-22 DIAGNOSIS — Z3202 Encounter for pregnancy test, result negative: Secondary | ICD-10-CM | POA: Insufficient documentation

## 2013-04-22 DIAGNOSIS — Z79899 Other long term (current) drug therapy: Secondary | ICD-10-CM | POA: Insufficient documentation

## 2013-04-22 DIAGNOSIS — R6883 Chills (without fever): Secondary | ICD-10-CM | POA: Insufficient documentation

## 2013-04-22 DIAGNOSIS — R51 Headache: Secondary | ICD-10-CM | POA: Insufficient documentation

## 2013-04-22 DIAGNOSIS — J029 Acute pharyngitis, unspecified: Secondary | ICD-10-CM | POA: Insufficient documentation

## 2013-04-22 DIAGNOSIS — F411 Generalized anxiety disorder: Secondary | ICD-10-CM | POA: Insufficient documentation

## 2013-04-22 DIAGNOSIS — J069 Acute upper respiratory infection, unspecified: Secondary | ICD-10-CM | POA: Insufficient documentation

## 2013-04-22 DIAGNOSIS — J3489 Other specified disorders of nose and nasal sinuses: Secondary | ICD-10-CM | POA: Insufficient documentation

## 2013-04-22 DIAGNOSIS — R630 Anorexia: Secondary | ICD-10-CM | POA: Insufficient documentation

## 2013-04-22 DIAGNOSIS — R000 Tachycardia, unspecified: Secondary | ICD-10-CM | POA: Insufficient documentation

## 2013-04-22 DIAGNOSIS — R42 Dizziness and giddiness: Secondary | ICD-10-CM | POA: Insufficient documentation

## 2013-04-22 LAB — COMPREHENSIVE METABOLIC PANEL
ALK PHOS: 63 U/L (ref 39–117)
ALT: 16 U/L (ref 0–35)
AST: 16 U/L (ref 0–37)
Albumin: 3.7 g/dL (ref 3.5–5.2)
BILIRUBIN TOTAL: 0.7 mg/dL (ref 0.3–1.2)
BUN: 10 mg/dL (ref 6–23)
CHLORIDE: 98 meq/L (ref 96–112)
CO2: 25 mEq/L (ref 19–32)
Calcium: 9.8 mg/dL (ref 8.4–10.5)
Creatinine, Ser: 0.63 mg/dL (ref 0.50–1.10)
GLUCOSE: 115 mg/dL — AB (ref 70–99)
POTASSIUM: 3.7 meq/L (ref 3.7–5.3)
Sodium: 138 mEq/L (ref 137–147)
Total Protein: 8.2 g/dL (ref 6.0–8.3)

## 2013-04-22 LAB — CBC WITH DIFFERENTIAL/PLATELET
Basophils Absolute: 0 10*3/uL (ref 0.0–0.1)
Basophils Relative: 0 % (ref 0–1)
Eosinophils Absolute: 0 10*3/uL (ref 0.0–0.7)
Eosinophils Relative: 0 % (ref 0–5)
HEMATOCRIT: 42.5 % (ref 36.0–46.0)
HEMOGLOBIN: 14.5 g/dL (ref 12.0–15.0)
LYMPHS ABS: 1.4 10*3/uL (ref 0.7–4.0)
Lymphocytes Relative: 16 % (ref 12–46)
MCH: 29.4 pg (ref 26.0–34.0)
MCHC: 34.1 g/dL (ref 30.0–36.0)
MCV: 86.2 fL (ref 78.0–100.0)
MONOS PCT: 7 % (ref 3–12)
Monocytes Absolute: 0.6 10*3/uL (ref 0.1–1.0)
NEUTROS ABS: 6.7 10*3/uL (ref 1.7–7.7)
NEUTROS PCT: 77 % (ref 43–77)
Platelets: 294 10*3/uL (ref 150–400)
RBC: 4.93 MIL/uL (ref 3.87–5.11)
RDW: 14 % (ref 11.5–15.5)
WBC: 8.7 10*3/uL (ref 4.0–10.5)

## 2013-04-22 LAB — URINE MICROSCOPIC-ADD ON

## 2013-04-22 LAB — POC URINE PREG, ED: PREG TEST UR: NEGATIVE

## 2013-04-22 LAB — URINALYSIS, ROUTINE W REFLEX MICROSCOPIC
BILIRUBIN URINE: NEGATIVE
Glucose, UA: NEGATIVE mg/dL
KETONES UR: NEGATIVE mg/dL
Leukocytes, UA: NEGATIVE
NITRITE: NEGATIVE
Protein, ur: 100 mg/dL — AB
Specific Gravity, Urine: 1.027 (ref 1.005–1.030)
UROBILINOGEN UA: 1 mg/dL (ref 0.0–1.0)
pH: 6 (ref 5.0–8.0)

## 2013-04-22 MED ORDER — ONDANSETRON 8 MG PO TBDP
8.0000 mg | ORAL_TABLET | Freq: Once | ORAL | Status: AC
  Administered 2013-04-22: 8 mg via ORAL
  Filled 2013-04-22: qty 1

## 2013-04-22 MED ORDER — IBUPROFEN 200 MG PO TABS
400.0000 mg | ORAL_TABLET | Freq: Once | ORAL | Status: AC
  Administered 2013-04-22: 400 mg via ORAL
  Filled 2013-04-22: qty 2

## 2013-04-22 MED ORDER — HYDROCODONE-ACETAMINOPHEN 5-325 MG PO TABS
1.0000 | ORAL_TABLET | Freq: Once | ORAL | Status: AC
  Administered 2013-04-22: 1 via ORAL
  Filled 2013-04-22: qty 1

## 2013-04-22 NOTE — ED Notes (Signed)
Pt reports generalized body aches, nausea, dizziness, and fever that began this morning at 0730. Pt denies emesis or diarrhea. Pt is A/O x4, in NAD, and tachycardic in triage.

## 2013-04-23 MED ORDER — IBUPROFEN 800 MG PO TABS: 800.0000 mg | ORAL_TABLET | Freq: Three times a day (TID) | ORAL | Status: DC

## 2013-04-23 MED ORDER — HYDROCODONE-ACETAMINOPHEN 5-325 MG PO TABS: 1.0000 | ORAL_TABLET | ORAL | Status: DC | PRN

## 2013-04-23 MED ORDER — ONDANSETRON HCL 4 MG PO TABS: 4.0000 mg | ORAL_TABLET | Freq: Four times a day (QID) | ORAL | Status: DC

## 2013-04-23 NOTE — ED Provider Notes (Signed)
CSN: [id number]     Arrival date & time 04/22/13  1922 History   First MD Initiated Contact with Patient 04/22/13 2255     Chief Complaint  Patient presents with  . Generalized Body Aches  . Nausea     (Consider location/radiation/quality/duration/timing/severity/associated sxs/prior Treatment) Patient is a 33 y.o. female presenting with URI. The history is provided by the patient. No language interpreter was used.  URI Presenting symptoms: congestion, facial pain and sore throat   Presenting symptoms: no cough and no fever   Associated symptoms: headaches and myalgias   Associated symptoms comment:  Body aches, frontal headache, congestion since this morning. She denies N, V but reports decreased appetite. No diarrhea. She is not aware of any sick contacts.    Past Medical History  Diagnosis Date  . Urinary tract infection   . Hypertension   . Headache(784.0)   . Anxiety    Past Surgical History  Procedure Laterality Date  . Cesarean section    . Breast reduction surgery  2000  . Cesarean section  07/03/2011    Procedure: CESAREAN SECTION;  Surgeon: Bing Plumehomas F Henley, MD;  Location: [facility];  Service: Gynecology;  Laterality: N/A;  Repeat Cesarean Section Delivery Boy @ 0102, Apgars 9/9   Family History  Problem Relation Age of Onset  . Anesthesia problems Neg Hx   . Hypotension Neg Hx   . Malignant hyperthermia Neg Hx   . Pseudochol deficiency Neg Hx   . Diabetes Maternal Aunt   . Diabetes Maternal Uncle   . Cancer Maternal Grandmother    History  Substance Use Topics  . Smoking status: Never Smoker   . Smokeless tobacco: Never Used  . Alcohol Use: No   OB History   Grav Para Term Preterm Abortions TAB SAB Ect Mult Living   2 2 2       2      Review of Systems  Constitutional: Positive for chills. Negative for fever.  HENT: Positive for congestion and sore throat. Negative for trouble swallowing and voice change.   Respiratory: Negative for cough and shortness of  breath.   Gastrointestinal: Negative for nausea, vomiting and abdominal pain.  Musculoskeletal: Positive for myalgias.  Skin: Negative for rash.  Neurological: Positive for dizziness and headaches. Negative for weakness.      Allergies  Review of patient's allergies indicates no known allergies.  Home Medications   Current Outpatient Rx  Name  Route  Sig  Dispense  Refill  . amLODipine (NORVASC) 5 MG tablet   Oral   Take 5 mg by mouth every evening.         . hydrochlorothiazide (HYDRODIURIL) 25 MG tablet   Oral   Take 12.5 mg by mouth every morning.          . traMADol (ULTRAM) 50 MG tablet   Oral   Take 50 mg by mouth every 6 (six) hours as needed for moderate pain.          BP 119/70  Pulse 97  Temp(Src) 98.3 F (36.8 C) (Oral)  Resp 16  SpO2 95%  LMP 04/08/2013 Physical Exam  Constitutional: She appears well-developed and well-nourished.  HENT:  Head: Normocephalic.  Neck: Normal range of motion. Neck supple.  Cardiovascular: Regular rhythm.  Tachycardia present.   Pulmonary/Chest: Effort normal and breath sounds normal.  Abdominal: Soft. Bowel sounds are normal. There is no tenderness. There is no rebound and no guarding.  Musculoskeletal: Normal range of motion.  Neurological: She is alert. No cranial nerve deficit.  Skin: Skin is warm and dry. No rash noted.  Psychiatric: She has a normal mood and affect.    ED Course  Procedures (including critical care time) Labs Review Labs Reviewed  COMPREHENSIVE METABOLIC PANEL - Abnormal; Notable for the following:    Glucose, Bld 115 (*)    All other components within normal limits  URINALYSIS, ROUTINE W REFLEX MICROSCOPIC - Abnormal; Notable for the following:    APPearance CLOUDY (*)    Hgb urine dipstick SMALL (*)    Protein, ur 100 (*)    All other components within normal limits  URINE MICROSCOPIC-ADD ON - Abnormal; Notable for the following:    Squamous Epithelial / LPF FEW (*)    All other  components within normal limits  CBC WITH DIFFERENTIAL  POC URINE PREG, ED   Results for orders placed during the hospital encounter of 04/22/13  CBC WITH DIFFERENTIAL      Result Value Ref Range   WBC 8.7  4.0 - 10.5 K/uL   RBC 4.93  3.87 - 5.11 MIL/uL   Hemoglobin 14.5  12.0 - 15.0 g/dL   HCT 96.0  45.4 - 09.8 %   MCV 86.2  78.0 - 100.0 fL   MCH 29.4  26.0 - 34.0 pg   MCHC 34.1  30.0 - 36.0 g/dL   RDW 11.9  14.7 - 82.9 %   Platelets 294  150 - 400 K/uL   Neutrophils Relative % 77  43 - 77 %   Neutro Abs 6.7  1.7 - 7.7 K/uL   Lymphocytes Relative 16  12 - 46 %   Lymphs Abs 1.4  0.7 - 4.0 K/uL   Monocytes Relative 7  3 - 12 %   Monocytes Absolute 0.6  0.1 - 1.0 K/uL   Eosinophils Relative 0  0 - 5 %   Eosinophils Absolute 0.0  0.0 - 0.7 K/uL   Basophils Relative 0  0 - 1 %   Basophils Absolute 0.0  0.0 - 0.1 K/uL  COMPREHENSIVE METABOLIC PANEL      Result Value Ref Range   Sodium 138  137 - 147 mEq/L   Potassium 3.7  3.7 - 5.3 mEq/L   Chloride 98  96 - 112 mEq/L   CO2 25  19 - 32 mEq/L   Glucose, Bld 115 (*) 70 - 99 mg/dL   BUN 10  6 - 23 mg/dL   Creatinine, Ser 5.62  0.50 - 1.10 mg/dL   Calcium 9.8  8.4 - 13.0 mg/dL   Total Protein 8.2  6.0 - 8.3 g/dL   Albumin 3.7  3.5 - 5.2 g/dL   AST 16  0 - 37 U/L   ALT 16  0 - 35 U/L   Alkaline Phosphatase 63  39 - 117 U/L   Total Bilirubin 0.7  0.3 - 1.2 mg/dL   GFR calc non Af Amer >90  >90 mL/min   GFR calc Af Amer >90  >90 mL/min  URINALYSIS, ROUTINE W REFLEX MICROSCOPIC      Result Value Ref Range   Color, Urine YELLOW  YELLOW   APPearance CLOUDY (*) CLEAR   Specific Gravity, Urine 1.027  1.005 - 1.030   pH 6.0  5.0 - 8.0   Glucose, UA NEGATIVE  NEGATIVE mg/dL   Hgb urine dipstick SMALL (*) NEGATIVE   Bilirubin Urine NEGATIVE  NEGATIVE   Ketones, ur NEGATIVE  NEGATIVE mg/dL   Protein,  ur 100 (*) NEGATIVE mg/dL   Urobilinogen, UA 1.0  0.0 - 1.0 mg/dL   Nitrite NEGATIVE  NEGATIVE   Leukocytes, UA NEGATIVE  NEGATIVE   URINE MICROSCOPIC-ADD ON      Result Value Ref Range   Squamous Epithelial / LPF FEW (*) RARE   WBC, UA 0-2  <3 WBC/hpf   RBC / HPF 3-6  <3 RBC/hpf   Bacteria, UA RARE  RARE   Urine-Other MUCOUS PRESENT    POC URINE PREG, ED      Result Value Ref Range   Preg Test, Ur NEGATIVE  NEGATIVE    Imaging Review No results found.   EKG Interpretation None      MDM   Final diagnoses:  None    1. Myalgias 2. URI  Patient is well appearing. Initially tachycardic, vital signs now significantly improved without intervention. Presentation likely viral.     Arnoldo Hooker, PA-C 04/23/13 0038

## 2013-04-23 NOTE — Discharge Instructions (Signed)
Antibiotic Nonuse   Your caregiver felt that the infection or problem was not one that would be helped with an antibiotic.  Infections may be caused by viruses or bacteria. Only a caregiver can tell which one of these is the likely cause of an illness. A cold is the most common cause of infection in both adults and children. A cold is a virus. Antibiotic treatment will have no effect on a viral infection. Viruses can lead to many lost days of work caring for sick children and many missed days of school. Children may catch as many as 10 "colds" or "flus" per year during which they can be tearful, cranky, and uncomfortable. The goal of treating a virus is aimed at keeping the ill person comfortable.  Antibiotics are medications used to help the body fight bacterial infections. There are relatively few types of bacteria that cause infections but there are hundreds of viruses. While both viruses and bacteria cause infection they are very different types of germs. A viral infection will typically go away by itself within 7 to 10 days. Bacterial infections may spread or get worse without antibiotic treatment.  Examples of bacterial infections are:   Sore throats (like strep throat or tonsillitis).   Infection in the lung (pneumonia).   Ear and skin infections.  Examples of viral infections are:   Colds or flus.   Most coughs and bronchitis.   Sore throats not caused by Strep.   Runny noses.  It is often best not to take an antibiotic when a viral infection is the cause of the problem. Antibiotics can kill off the helpful bacteria that we have inside our body and allow harmful bacteria to start growing. Antibiotics can cause side effects such as allergies, nausea, and diarrhea without helping to improve the symptoms of the viral infection. Additionally, repeated uses of antibiotics can cause bacteria inside of our body to become resistant. That resistance can be passed onto harmful bacterial. The next time you have  an infection it may be harder to treat if antibiotics are used when they are not needed. Not treating with antibiotics allows our own immune system to develop and take care of infections more efficiently. Also, antibiotics will work better for us when they are prescribed for bacterial infections.  Treatments for a child that is ill may include:   Give extra fluids throughout the day to stay hydrated.   Get plenty of rest.   Only give your child over-the-counter or prescription medicines for pain, discomfort, or fever as directed by your caregiver.   The use of a cool mist humidifier may help stuffy noses.   Cold medications if suggested by your caregiver.  Your caregiver may decide to start you on an antibiotic if:   The problem you were seen for today continues for a longer length of time than expected.   You develop a secondary bacterial infection.  SEEK MEDICAL CARE IF:   Fever lasts longer than 5 days.   Symptoms continue to get worse after 5 to 7 days or become severe.   Difficulty in breathing develops.   Signs of dehydration develop (poor drinking, rare urinating, dark colored urine).   Changes in behavior or worsening tiredness (listlessness or lethargy).  Document Released: 03/29/2001 Document Revised: 04/12/2011 Document Reviewed: 09/25/2008  [employer] Patient Information 2014 [employer].

## 2013-04-23 NOTE — ED Provider Notes (Signed)
Medical screening examination/treatment/procedure(s) were performed by non-physician practitioner and as supervising physician I was immediately available for consultation/collaboration.   EKG Interpretation None        Noura Purpura, MD 04/23/13 0600 

## 2013-05-24 ENCOUNTER — Encounter (HOSPITAL_COMMUNITY): Payer: Self-pay | Admitting: Emergency Medicine

## 2013-05-24 ENCOUNTER — Emergency Department (HOSPITAL_COMMUNITY): Payer: Medicaid Other

## 2013-05-24 ENCOUNTER — Emergency Department (HOSPITAL_COMMUNITY)
Admission: EM | Admit: 2013-05-24 | Discharge: 2013-05-25 | Disposition: A | Discharge status: Home / Self Care | Payer: Medicaid Other | Attending: Emergency Medicine | Admitting: Emergency Medicine

## 2013-05-24 DIAGNOSIS — I10 Essential (primary) hypertension: Secondary | ICD-10-CM | POA: Insufficient documentation

## 2013-05-24 DIAGNOSIS — Z3202 Encounter for pregnancy test, result negative: Secondary | ICD-10-CM | POA: Insufficient documentation

## 2013-05-24 DIAGNOSIS — Z79899 Other long term (current) drug therapy: Secondary | ICD-10-CM | POA: Insufficient documentation

## 2013-05-24 DIAGNOSIS — R079 Chest pain, unspecified: Secondary | ICD-10-CM

## 2013-05-24 DIAGNOSIS — Z8659 Personal history of other mental and behavioral disorders: Secondary | ICD-10-CM | POA: Insufficient documentation

## 2013-05-24 DIAGNOSIS — Z87440 Personal history of urinary (tract) infections: Secondary | ICD-10-CM | POA: Insufficient documentation

## 2013-05-24 DIAGNOSIS — R0789 Other chest pain: Secondary | ICD-10-CM | POA: Insufficient documentation

## 2013-05-24 LAB — HEPATIC FUNCTION PANEL
ALT: 14 U/L (ref 0–35)
AST: 15 U/L (ref 0–37)
Albumin: 3.8 g/dL (ref 3.5–5.2)
Alkaline Phosphatase: 59 U/L (ref 39–117)
Total Bilirubin: 0.4 mg/dL (ref 0.3–1.2)
Total Protein: 7.9 g/dL (ref 6.0–8.3)

## 2013-05-24 LAB — I-STAT TROPONIN, ED: Troponin i, poc: 0 ng/mL (ref 0.00–0.08)

## 2013-05-24 LAB — BASIC METABOLIC PANEL
BUN: 11 mg/dL (ref 6–23)
CALCIUM: 9.4 mg/dL (ref 8.4–10.5)
CO2: 27 mEq/L (ref 19–32)
Chloride: 97 mEq/L (ref 96–112)
Creatinine, Ser: 0.63 mg/dL (ref 0.50–1.10)
GFR calc Af Amer: >90 mL/min (ref >90)
GFR calc non Af Amer: >90 mL/min (ref >90)
Glucose, Bld: 88 mg/dL (ref 70–99)
Potassium: 3.7 mEq/L (ref 3.7–5.3)
SODIUM: 137 meq/L (ref 137–147)

## 2013-05-24 LAB — CBC
HCT: 36.6 % (ref 36.0–46.0)
HEMOGLOBIN: 12.3 g/dL (ref 12.0–15.0)
MCH: 28.5 pg (ref 26.0–34.0)
MCHC: 33.6 g/dL (ref 30.0–36.0)
MCV: 84.9 fL (ref 78.0–100.0)
Platelets: 296 10*3/uL (ref 150–400)
RBC: 4.31 MIL/uL (ref 3.87–5.11)
RDW: 13.7 % (ref 11.5–15.5)
WBC: 11.2 10*3/uL — ABNORMAL HIGH (ref 4.0–10.5)

## 2013-05-24 LAB — POC URINE PREG, ED: Preg Test, Ur: NEGATIVE

## 2013-05-24 LAB — LIPASE, BLOOD: Lipase: 21 U/L (ref 11–59)

## 2013-05-24 NOTE — ED Provider Notes (Signed)
CSN: [id number]     Arrival date & time 05/24/13  2044 History   First MD Initiated Contact with Patient 05/24/13 2231     Chief Complaint  Patient presents with  . Chest Pain     (Consider location/radiation/quality/duration/timing/severity/associated sxs/prior Treatment) HPI Jamie Hays is a 33 y.o. female who presents emergency department complaining of upper abdominal pain radiating to her chest. Patient states her pain began this afternoon. States she thought it was "gas pains" and tried to drink some water and burp. States it did help some but pain continued. She went to an urgent care where they did EKG and told her was normal, but were told to come to emergency department to get "labs and other tests to make sure to have a heart attack." Patient denies any prior cardiac problems. She reports history of GERD when she was pregnant. She did not try taking any medications for this. She does have history of hypertension, otherwise no medical issues. She's not a smoker. She has no family history of cardiac problems. She has not had any recent travel or surgeries. She denies any current pain, denies any current shortness of breath. There were no associated symptoms with this pain earlier, she denies any prior nausea, diaphoresis, dizziness, shortness of breath. No swelling in extremities.  Past Medical History  Diagnosis Date  . Urinary tract infection   . Hypertension   . Headache(784.0)   . Anxiety    Past Surgical History  Procedure Laterality Date  . Cesarean section    . Breast reduction surgery  2000  . Cesarean section  07/03/2011    Procedure: CESAREAN SECTION;  Surgeon: Bing Plumehomas F Henley, MD;  Location: [facility];  Service: Gynecology;  Laterality: N/A;  Repeat Cesarean Section Delivery Boy @ 0102, Apgars 9/9   Family History  Problem Relation Age of Onset  . Anesthesia problems Neg Hx   . Hypotension Neg Hx   . Malignant hyperthermia Neg Hx   . Pseudochol deficiency Neg Hx    . Diabetes Maternal Aunt   . Diabetes Maternal Uncle   . Cancer Maternal Grandmother    History  Substance Use Topics  . Smoking status: Never Smoker   . Smokeless tobacco: Never Used  . Alcohol Use: No   OB History   Grav Para Term Preterm Abortions TAB SAB Ect Mult Living   2 2 2       2      Review of Systems  Constitutional: Negative for fever and chills.  Respiratory: Positive for chest tightness. Negative for cough and shortness of breath.   Cardiovascular: Positive for chest pain. Negative for palpitations and leg swelling.  Gastrointestinal: Negative for nausea, vomiting, abdominal pain and diarrhea.  Genitourinary: Negative for dysuria, flank pain, vaginal bleeding, vaginal discharge, vaginal pain and pelvic pain.  Musculoskeletal: Negative for arthralgias, myalgias, neck pain and neck stiffness.  Skin: Negative for rash.  Neurological: Negative for dizziness, weakness and headaches.  All other systems reviewed and are negative.     Allergies  Review of patient's allergies indicates no known allergies.  Home Medications   Prior to Admission medications   Medication Sig Start Date End Date Taking? Authorizing Provider  amLODipine (NORVASC) 10 MG tablet Take 10 mg by mouth every evening.   Yes Historical Provider, MD  hydrochlorothiazide (HYDRODIURIL) 25 MG tablet Take 12.5 mg by mouth every morning.    Yes Historical Provider, MD  HYDROcodone-acetaminophen (NORCO/VICODIN) 5-325 MG per tablet Take 1-2 tablets by  mouth every 4 (four) hours as needed. 04/23/13  Yes Shari A Upstill, PA-C   BP 143/89  Pulse 78  Temp(Src) 98.3 F (36.8 C) (Oral)  SpO2 100%  LMP 05/19/2013  Breastfeeding? No Physical Exam  Nursing note and vitals reviewed. Constitutional: She appears well-developed and well-nourished. No distress.  HENT:  Head: Normocephalic.  Eyes: Conjunctivae are normal.  Neck: Neck supple.  Cardiovascular: Normal rate, regular rhythm and normal heart sounds.    Pulmonary/Chest: Effort normal and breath sounds normal. No respiratory distress. She has no wheezes. She has no rales.  Abdominal: Soft. Bowel sounds are normal. She exhibits no distension. There is no tenderness. There is no rebound.  Musculoskeletal: She exhibits no edema.  Neurological: She is alert.  Skin: Skin is warm and dry.  Psychiatric: She has a normal mood and affect. Her behavior is normal.    ED Course  Procedures (including critical care time) Labs Review Labs Reviewed  CBC - Abnormal; Notable for the following:    WBC 11.2 (*)    All other components within normal limits  BASIC METABOLIC PANEL  HEPATIC FUNCTION PANEL  LIPASE, BLOOD  I-STAT TROPOININ, ED  POC URINE PREG, ED    Imaging Review Dg Chest 2 View  05/24/2013   CLINICAL DATA:  Sudden onset chest pain.  EXAM: CHEST  2 VIEW  COMPARISON:  10/11/2012  FINDINGS: The heart size and mediastinal contours are within normal limits. Both lungs are clear. The visualized skeletal structures are unremarkable.  IMPRESSION: No active cardiopulmonary disease.   Electronically Signed   By: Burman NievesWilliam  Stevens M.D.   On: 05/24/2013 23:38     EKG Interpretation None      Date: 05/25/2013  Rate: 79  Rhythm: normal sinus rhythm and premature atrial contractions (PAC)  QRS Axis: normal  Intervals: normal  ST/T Wave abnormalities: normal  Conduction Disutrbances:none  Narrative Interpretation:   Old EKG Reviewed: none available   MDM   Final diagnoses:  Chest pain    Patient in emergency department with atypical chest pain. It is now resolved. Patient did burp which made the pain better. She has no risk factors for coronary disease other than hypertension she has no family history of cardiac disease. She is otherwise healthy. Labs, EKG, troponin, chest x-ray-year-old unremarkable. Patient is pain-free at this time, pain onset greater than 6 hours, I do not think she needs a delta troponin. Her vital signs are normal,  she's PERC negative. I do not think she has a PE. I think her pain is most likely related to GERD. We'll discharge home, and stretch take Pepcid. followup with primary care Dr.   Ceasar MonsFiled Vitals:   05/24/13 2104 05/25/13 0014  BP: 143/89 133/81  Pulse: 78 84  Temp: 98.3 F (36.8 C)   TempSrc: Oral   SpO2: 100% 99%     Quasim Doyon A Wiliam Cauthorn, PA-C 05/25/13 0030

## 2013-05-24 NOTE — ED Notes (Signed)
Per patient-pain started today underneath left breast and radiated to right breast. Patient assumed it was "gas pains." Tried drinking water and burping. Felt some alleviation but upon bending over felt the pain come back. Denies radiation to shoulder, neck or back. Denies syncope, dizziness, or falling. Saw urgent care provider and and was told EKG was normal but recommended she see MD here to "run tests and labs." Denies any precipitating events for CP.

## 2013-05-25 NOTE — Discharge Instructions (Signed)
Your lab work and chest x-ray are normal today. Start taking pepcid for possible acid reflux. If symptoms continue, make sure to follow up with primary care doctor for recheck. Return if worsening.    Chest Pain (Nonspecific) It is often hard to give a specific diagnosis for the cause of chest pain. There is always a chance that your pain could be related to something serious, such as a heart attack or a blood clot in the lungs. You need to follow up with your caregiver for further evaluation. CAUSES   Heartburn.  Pneumonia or bronchitis.  Anxiety or stress.  Inflammation around your heart (pericarditis) or lung (pleuritis or pleurisy).  A blood clot in the lung.  A collapsed lung (pneumothorax). It can develop suddenly on its own (spontaneous pneumothorax) or from injury (trauma) to the chest.  Shingles infection (herpes zoster virus). The chest wall is composed of bones, muscles, and cartilage. Any of these can be the source of the pain.  The bones can be bruised by injury.  The muscles or cartilage can be strained by coughing or overwork.  The cartilage can be affected by inflammation and become sore (costochondritis). DIAGNOSIS  Lab tests or other studies, such as X-rays, electrocardiography, stress testing, or cardiac imaging, may be needed to find the cause of your pain.  TREATMENT   Treatment depends on what may be causing your chest pain. Treatment may include:  Acid blockers for heartburn.  Anti-inflammatory medicine.  Pain medicine for inflammatory conditions.  Antibiotics if an infection is present.  You may be advised to change lifestyle habits. This includes stopping smoking and avoiding alcohol, caffeine, and chocolate.  You may be advised to keep your head raised (elevated) when sleeping. This reduces the chance of acid going backward from your stomach into your esophagus.  Most of the time, nonspecific chest pain will improve within 2 to 3 days with rest  and mild pain medicine. HOME CARE INSTRUCTIONS   If antibiotics were prescribed, take your antibiotics as directed. Finish them even if you start to feel better.  For the next few days, avoid physical activities that bring on chest pain. Continue physical activities as directed.  Do not smoke.  Avoid drinking alcohol.  Only take over-the-counter or prescription medicine for pain, discomfort, or fever as directed by your caregiver.  Follow your caregiver's suggestions for further testing if your chest pain does not go away.  Keep any follow-up appointments you made. If you do not go to an appointment, you could develop lasting (chronic) problems with pain. If there is any problem keeping an appointment, you must call to reschedule. SEEK MEDICAL CARE IF:   You think you are having problems from the medicine you are taking. Read your medicine instructions carefully.  Your chest pain does not go away, even after treatment.  You develop a rash with blisters on your chest. SEEK IMMEDIATE MEDICAL CARE IF:   You have increased chest pain or pain that spreads to your arm, neck, jaw, back, or abdomen.  You develop shortness of breath, an increasing cough, or you are coughing up blood.  You have severe back or abdominal pain, feel nauseous, or vomit.  You develop severe weakness, fainting, or chills.  You have a fever. THIS IS AN EMERGENCY. Do not wait to see if the pain will go away. Get medical help at once. Call your local emergency services (911 in [place] Do not drive yourself to the hospital. MAKE SURE YOU:   Understand  these instructions.  Will watch your condition.  Will get help right away if you are not doing well or get worse. Document Released: 10/28/2004 Document Revised: 04/12/2011 Document Reviewed: 08/24/2007 [facility] Patient Information 2014 [place].

## 2013-05-25 NOTE — ED Provider Notes (Signed)
Medical screening examination/treatment/procedure(s) were performed by non-physician practitioner and as supervising physician I was immediately available for consultation/collaboration.   EKG Interpretation None        Jamie SeamenJohn L Eugene Isadore, MD 05/25/13 [phone]

## 2013-06-13 ENCOUNTER — Other Ambulatory Visit: Payer: Self-pay | Admitting: Family Medicine

## 2013-06-13 ENCOUNTER — Other Ambulatory Visit (HOSPITAL_COMMUNITY)
Admission: RE | Admit: 2013-06-13 | Discharge: 2013-06-13 | Disposition: A | Discharge status: Home / Self Care | Payer: Medicaid Other | Source: Ambulatory Visit | Attending: Family Medicine | Admitting: Family Medicine

## 2013-06-13 DIAGNOSIS — Z01419 Encounter for gynecological examination (general) (routine) without abnormal findings: Secondary | ICD-10-CM | POA: Diagnosis present

## 2013-06-13 DIAGNOSIS — Z1151 Encounter for screening for human papillomavirus (HPV): Secondary | ICD-10-CM | POA: Insufficient documentation

## 2013-12-03 ENCOUNTER — Encounter (HOSPITAL_COMMUNITY): Payer: Self-pay | Admitting: Emergency Medicine

## 2014-02-12 ENCOUNTER — Emergency Department (HOSPITAL_COMMUNITY)
Admission: EM | Admit: 2014-02-12 | Discharge: 2014-02-13 | Disposition: A | Discharge status: Home / Self Care | Payer: 59 | Attending: Emergency Medicine | Admitting: Emergency Medicine

## 2014-02-12 ENCOUNTER — Encounter (HOSPITAL_COMMUNITY): Payer: Self-pay | Admitting: Emergency Medicine

## 2014-02-12 DIAGNOSIS — R1011 Right upper quadrant pain: Secondary | ICD-10-CM | POA: Diagnosis not present

## 2014-02-12 DIAGNOSIS — F419 Anxiety disorder, unspecified: Secondary | ICD-10-CM | POA: Diagnosis not present

## 2014-02-12 DIAGNOSIS — Z79899 Other long term (current) drug therapy: Secondary | ICD-10-CM | POA: Insufficient documentation

## 2014-02-12 DIAGNOSIS — I10 Essential (primary) hypertension: Secondary | ICD-10-CM | POA: Diagnosis not present

## 2014-02-12 DIAGNOSIS — M436 Torticollis: Secondary | ICD-10-CM | POA: Diagnosis not present

## 2014-02-12 DIAGNOSIS — Z87440 Personal history of urinary (tract) infections: Secondary | ICD-10-CM | POA: Insufficient documentation

## 2014-02-12 NOTE — ED Notes (Signed)
Pt states she recently purchased new pillows began having pain to L neck 5 days ago, pain now radiating to R neck, shoulder and down back.

## 2014-02-13 ENCOUNTER — Other Ambulatory Visit (HOSPITAL_COMMUNITY): Payer: Self-pay | Admitting: Emergency Medicine

## 2014-02-13 ENCOUNTER — Ambulatory Visit (HOSPITAL_COMMUNITY)
Admission: RE | Admit: 2014-02-13 | Discharge: 2014-02-13 | Disposition: A | Discharge status: Home / Self Care | Payer: 59 | Source: Ambulatory Visit | Attending: Emergency Medicine | Admitting: Emergency Medicine

## 2014-02-13 DIAGNOSIS — M436 Torticollis: Secondary | ICD-10-CM | POA: Diagnosis not present

## 2014-02-13 DIAGNOSIS — R1011 Right upper quadrant pain: Secondary | ICD-10-CM | POA: Diagnosis present

## 2014-02-13 LAB — I-STAT CHEM 8, ED
BUN: 9 mg/dL (ref 6–23)
CHLORIDE: 98 meq/L (ref 96–112)
Calcium, Ion: 1.13 mmol/L (ref 1.12–1.23)
Creatinine, Ser: 0.6 mg/dL (ref 0.50–1.10)
Glucose, Bld: 100 mg/dL — ABNORMAL HIGH (ref 70–99)
HCT: 43 % (ref 36.0–46.0)
Hemoglobin: 14.6 g/dL (ref 12.0–15.0)
Potassium: 3.4 mmol/L — ABNORMAL LOW (ref 3.5–5.1)
Sodium: 138 mmol/L (ref 135–145)
TCO2: 25 mmol/L (ref 0–100)

## 2014-02-13 LAB — CBC WITH DIFFERENTIAL/PLATELET
BASOS ABS: 0 10*3/uL (ref 0.0–0.1)
Basophils Relative: 0 % (ref 0–1)
EOS PCT: 1 % (ref 0–5)
Eosinophils Absolute: 0.1 10*3/uL (ref 0.0–0.7)
HEMATOCRIT: 38.8 % (ref 36.0–46.0)
HEMOGLOBIN: 13 g/dL (ref 12.0–15.0)
Lymphocytes Relative: 24 % (ref 12–46)
Lymphs Abs: 2.6 10*3/uL (ref 0.7–4.0)
MCH: 28.6 pg (ref 26.0–34.0)
MCHC: 33.5 g/dL (ref 30.0–36.0)
MCV: 85.5 fL (ref 78.0–100.0)
MONOS PCT: 11 % (ref 3–12)
Monocytes Absolute: 1.2 10*3/uL — ABNORMAL HIGH (ref 0.1–1.0)
Neutro Abs: 7.3 10*3/uL (ref 1.7–7.7)
Neutrophils Relative %: 64 % (ref 43–77)
Platelets: 308 10*3/uL (ref 150–400)
RBC: 4.54 MIL/uL (ref 3.87–5.11)
RDW: 14.5 % (ref 11.5–15.5)
WBC: 11.2 10*3/uL — ABNORMAL HIGH (ref 4.0–10.5)

## 2014-02-13 LAB — COMPREHENSIVE METABOLIC PANEL
ALT: 17 U/L (ref 0–35)
ANION GAP: 9 (ref 5–15)
AST: 19 U/L (ref 0–37)
Albumin: 3.7 g/dL (ref 3.5–5.2)
Alkaline Phosphatase: 50 U/L (ref 39–117)
BUN: 10 mg/dL (ref 6–23)
CALCIUM: 8.9 mg/dL (ref 8.4–10.5)
CO2: 29 mmol/L (ref 19–32)
CREATININE: 0.62 mg/dL (ref 0.50–1.10)
Chloride: 99 mEq/L (ref 96–112)
GFR calc Af Amer: >90 mL/min (ref >90)
GFR calc non Af Amer: >90 mL/min (ref >90)
Glucose, Bld: 97 mg/dL (ref 70–99)
Potassium: 3.3 mmol/L — ABNORMAL LOW (ref 3.5–5.1)
SODIUM: 137 mmol/L (ref 135–145)
TOTAL PROTEIN: 7.5 g/dL (ref 6.0–8.3)
Total Bilirubin: 1 mg/dL (ref 0.3–1.2)

## 2014-02-13 LAB — I-STAT TROPONIN, ED: TROPONIN I, POC: 0 ng/mL (ref 0.00–0.08)

## 2014-02-13 LAB — TROPONIN I: Troponin I: <0.03 ng/mL (ref <0.031)

## 2014-02-13 LAB — LIPASE, BLOOD: LIPASE: 21 U/L (ref 11–59)

## 2014-02-13 MED ORDER — HYDROCODONE-ACETAMINOPHEN 5-325 MG PO TABS: 1.0000 | ORAL_TABLET | Freq: Four times a day (QID) | ORAL | Status: DC | PRN

## 2014-02-13 MED ORDER — DIAZEPAM 5 MG PO TABS
5.0000 mg | ORAL_TABLET | Freq: Once | ORAL | Status: AC
  Administered 2014-02-13: 5 mg via ORAL
  Filled 2014-02-13: qty 1

## 2014-02-13 MED ORDER — HYDROMORPHONE HCL 1 MG/ML IJ SOLN
INTRAMUSCULAR | Status: AC
  Filled 2014-02-13: qty 1

## 2014-02-13 MED ORDER — DIAZEPAM 5 MG PO TABS: 5.0000 mg | ORAL_TABLET | Freq: Three times a day (TID) | ORAL | Status: DC | PRN

## 2014-02-13 MED ORDER — HYDROCODONE-ACETAMINOPHEN 5-325 MG PO TABS
1.0000 | ORAL_TABLET | Freq: Once | ORAL | Status: AC
  Administered 2014-02-13: 1 via ORAL
  Filled 2014-02-13: qty 1

## 2014-02-13 MED ORDER — ONDANSETRON HCL 4 MG/2ML IJ SOLN
INTRAMUSCULAR | Status: AC
  Filled 2014-02-13: qty 2

## 2014-02-13 MED ORDER — KETOROLAC TROMETHAMINE 30 MG/ML IJ SOLN
30.0000 mg | Freq: Once | INTRAMUSCULAR | Status: AC
  Administered 2014-02-13: 30 mg via INTRAMUSCULAR
  Filled 2014-02-13: qty 1

## 2014-02-13 MED ORDER — MORPHINE SULFATE 4 MG/ML IJ SOLN
INTRAMUSCULAR | Status: AC
  Filled 2014-02-13: qty 1

## 2014-02-13 NOTE — ED Notes (Signed)
Pt is complaining of right sided neck pain that radiates to right arm and down below right breast. Lungs sounds all clear and has no respiratory distress.

## 2014-02-13 NOTE — Discharge Instructions (Signed)
Abdominal Pain Many things can cause belly (abdominal) pain. Most times, the belly pain is not dangerous. Many cases of belly pain can be watched and treated at home. HOME CARE   Do not take medicines that help you go poop (laxatives) unless told to by your doctor.  Only take medicine as told by your doctor.  Eat or drink as told by your doctor. Your doctor will tell you if you should be on a special diet. GET HELP IF:  You do not know what is causing your belly pain.  You have belly pain while you are sick to your stomach (nauseous) or have runny poop (diarrhea).  You have pain while you pee or poop.  Your belly pain wakes you up at night.  You have belly pain that gets worse or better when you eat.  You have belly pain that gets worse when you eat fatty foods.  You have a fever. GET HELP RIGHT AWAY IF:   The pain does not go away within 2 hours.  You keep throwing up (vomiting).  The pain changes and is only in the right or left part of the belly.  You have bloody or tarry looking poop. MAKE SURE YOU:   Understand these instructions.  Will watch your condition.  Will get help right away if you are not doing well or get worse. Document Released: 07/07/2007 Document Revised: 01/23/2013 Document Reviewed: 09/27/2012 [facility] Patient Information 2015 [place] [place]. This information is not intended to replace advice given to you by your health care provider. Make sure you discuss any questions you have with your health care provider. No exact cause for your pain has been identified tonight  Please take the medications as directed follow up with your PCP

## 2014-02-13 NOTE — ED Notes (Signed)
Provided patient heat packs.

## 2014-02-13 NOTE — ED Provider Notes (Signed)
CSN: [id number]     Arrival date & time 02/12/14  2229 History   First MD Initiated Contact with Patient 02/13/14 0004     Chief Complaint  Patient presents with  . Torticollis     (Consider location/radiation/quality/duration/timing/severity/associated sxs/prior Treatment) HPI Comments: Since getting new pillows last week has been having neck pain first on lewft and now on right  The pain radiates to anterior and posterior chest   The history is provided by the patient.    Past Medical History  Diagnosis Date  . Urinary tract infection   . Hypertension   . Headache(784.0)   . Anxiety    Past Surgical History  Procedure Laterality Date  . Cesarean section    . Breast reduction surgery  2000  . Cesarean section  07/03/2011    Procedure: CESAREAN SECTION;  Surgeon: Bing Plumehomas F Henley, MD;  Location: [facility];  Service: Gynecology;  Laterality: N/A;  Repeat Cesarean Section Delivery Boy @ 0102, Apgars 9/9   Family History  Problem Relation Age of Onset  . Anesthesia problems Neg Hx   . Hypotension Neg Hx   . Malignant hyperthermia Neg Hx   . Pseudochol deficiency Neg Hx   . Diabetes Maternal Aunt   . Diabetes Maternal Uncle   . Cancer Maternal Grandmother    History  Substance Use Topics  . Smoking status: Never Smoker   . Smokeless tobacco: Never Used  . Alcohol Use: No   OB History    Gravida Para Term Preterm AB TAB SAB Ectopic Multiple Living   2 2 2       2      Review of Systems  Constitutional: Negative for fever and chills.  Eyes: Negative for visual disturbance.  Respiratory: Negative for shortness of breath.   Cardiovascular: Negative for chest pain.  Musculoskeletal: Positive for neck pain. Negative for neck stiffness.  Neurological: Negative for numbness.      Allergies  Review of patient's allergies indicates no known allergies.  Home Medications   Prior to Admission medications   Medication Sig Start Date End Date Taking? Authorizing Provider   acetaminophen (TYLENOL) 500 MG tablet Take 500 mg by mouth every 6 (six) hours as needed (for pain.).   Yes Historical Provider, MD  Acetaminophen-Aspirin Buffered (EXCEDRIN BACK & BODY) 250-250 MG tablet Take 2 tablets by mouth every 4 (four) hours as needed (for pain.).   Yes Historical Provider, MD  amLODipine (NORVASC) 10 MG tablet Take 10 mg by mouth every evening.   Yes Historical Provider, MD  hydrochlorothiazide (HYDRODIURIL) 25 MG tablet Take 12.5 mg by mouth every morning.    Yes Historical Provider, MD  metFORMIN (GLUCOPHAGE-XR) 500 MG 24 hr tablet Take 500 mg by mouth daily. 01/09/14  Yes Historical Provider, MD  diazepam (VALIUM) 5 MG tablet Take 1 tablet (5 mg total) by mouth every 8 (eight) hours as needed for anxiety. 02/13/14   Arman FilterGail K Marvis Bakken, NP  HYDROcodone-acetaminophen (NORCO/VICODIN) 5-325 MG per tablet Take 1-2 tablets by mouth every 6 (six) hours as needed. 02/13/14   Arman FilterGail K Zyion Leidner, NP   BP 141/101 mmHg  Pulse 105  Temp(Src) 98.1 F (36.7 C) (Oral)  Resp 18  Ht 5\' 2"  (1.575 m)  Wt 245 lb (111.131 kg)  BMI 44.80 kg/m2  SpO2 98%  LMP 01/13/2014 Physical Exam  Constitutional: She is oriented to person, place, and time. She appears well-developed and well-nourished.  HENT:  Head: Normocephalic.  Neck: Muscular tenderness present. No spinous  process tenderness present. Decreased range of motion present.    Musculoskeletal: She exhibits tenderness. She exhibits no edema.  Neurological: She is alert and oriented to person, place, and time.  Skin: Skin is warm. No rash noted. No erythema.    ED Course  Procedures (including critical care time) Labs Review Labs Reviewed  CBC WITH DIFFERENTIAL - Abnormal; Notable for the following:    WBC 11.2 (*)    Monocytes Absolute 1.2 (*)    All other components within normal limits  COMPREHENSIVE METABOLIC PANEL - Abnormal; Notable for the following:    Potassium 3.3 (*)    All other components within normal limits  I-STAT  CHEM 8, ED - Abnormal; Notable for the following:    Potassium 3.4 (*)    Glucose, Bld 100 (*)    All other components within normal limits  LIPASE, BLOOD  TROPONIN I  I-STAT TROPOININ, ED    Imaging Review No results found.   EKG Interpretation None     Patient has continued pain in RUQ neck pain has resolved with medication  MDM   Final diagnoses:  Torticollis, acquired  Right upper quadrant pain         Arman Filter, NP 02/13/14 0025  Arman Filter, NP 02/13/14 4098  Olivia Mackie, MD 02/13/14 [phone]

## 2014-02-13 NOTE — ED Notes (Signed)
Discontinued IV (20g Left Forearm) that was charted on paper. Cath intact. Applied bandage to site.

## 2014-05-31 ENCOUNTER — Emergency Department (HOSPITAL_COMMUNITY)
Admission: EM | Admit: 2014-05-31 | Discharge: 2014-05-31 | Disposition: A | Discharge status: Home / Self Care | Payer: 59 | Attending: Emergency Medicine | Admitting: Emergency Medicine

## 2014-05-31 ENCOUNTER — Emergency Department (HOSPITAL_COMMUNITY): Payer: 59

## 2014-05-31 ENCOUNTER — Encounter (HOSPITAL_COMMUNITY): Payer: Self-pay | Admitting: Emergency Medicine

## 2014-05-31 DIAGNOSIS — Z79899 Other long term (current) drug therapy: Secondary | ICD-10-CM | POA: Insufficient documentation

## 2014-05-31 DIAGNOSIS — Z3202 Encounter for pregnancy test, result negative: Secondary | ICD-10-CM | POA: Diagnosis not present

## 2014-05-31 DIAGNOSIS — N739 Female pelvic inflammatory disease, unspecified: Secondary | ICD-10-CM | POA: Insufficient documentation

## 2014-05-31 DIAGNOSIS — R102 Pelvic and perineal pain: Secondary | ICD-10-CM

## 2014-05-31 DIAGNOSIS — Z87440 Personal history of urinary (tract) infections: Secondary | ICD-10-CM | POA: Diagnosis not present

## 2014-05-31 DIAGNOSIS — F419 Anxiety disorder, unspecified: Secondary | ICD-10-CM | POA: Insufficient documentation

## 2014-05-31 DIAGNOSIS — I10 Essential (primary) hypertension: Secondary | ICD-10-CM | POA: Diagnosis not present

## 2014-05-31 DIAGNOSIS — R10813 Right lower quadrant abdominal tenderness: Secondary | ICD-10-CM

## 2014-05-31 DIAGNOSIS — R55 Syncope and collapse: Secondary | ICD-10-CM | POA: Diagnosis present

## 2014-05-31 DIAGNOSIS — N83519 Torsion of ovary and ovarian pedicle, unspecified side: Secondary | ICD-10-CM

## 2014-05-31 LAB — URINALYSIS, ROUTINE W REFLEX MICROSCOPIC
Bilirubin Urine: NEGATIVE
GLUCOSE, UA: NEGATIVE mg/dL
Ketones, ur: NEGATIVE mg/dL
NITRITE: NEGATIVE
Protein, ur: NEGATIVE mg/dL
SPECIFIC GRAVITY, URINE: 1.018 (ref 1.005–1.030)
Urobilinogen, UA: 1 mg/dL (ref 0.0–1.0)
pH: 8 (ref 5.0–8.0)

## 2014-05-31 LAB — CBC WITH DIFFERENTIAL/PLATELET
Basophils Absolute: 0 10*3/uL (ref 0.0–0.1)
Basophils Relative: 0 % (ref 0–1)
EOS ABS: 0.1 10*3/uL (ref 0.0–0.7)
Eosinophils Relative: 1 % (ref 0–5)
HCT: 40.6 % (ref 36.0–46.0)
Hemoglobin: 13.7 g/dL (ref 12.0–15.0)
LYMPHS PCT: 18 % (ref 12–46)
Lymphs Abs: 2.4 10*3/uL (ref 0.7–4.0)
MCH: 29 pg (ref 26.0–34.0)
MCHC: 33.7 g/dL (ref 30.0–36.0)
MCV: 86 fL (ref 78.0–100.0)
Monocytes Absolute: 1 10*3/uL (ref 0.1–1.0)
Monocytes Relative: 8 % (ref 3–12)
NEUTROS ABS: 10.1 10*3/uL — AB (ref 1.7–7.7)
NEUTROS PCT: 73 % (ref 43–77)
Platelets: 362 10*3/uL (ref 150–400)
RBC: 4.72 MIL/uL (ref 3.87–5.11)
RDW: 14.1 % (ref 11.5–15.5)
WBC: 13.7 10*3/uL — ABNORMAL HIGH (ref 4.0–10.5)

## 2014-05-31 LAB — COMPREHENSIVE METABOLIC PANEL
ALBUMIN: 4.2 g/dL (ref 3.5–5.2)
ALK PHOS: 49 U/L (ref 39–117)
ALT: 18 U/L (ref 0–35)
ANION GAP: 13 (ref 5–15)
AST: 24 U/L (ref 0–37)
BUN: 11 mg/dL (ref 6–23)
CHLORIDE: 102 mmol/L (ref 96–112)
CO2: 22 mmol/L (ref 19–32)
Calcium: 9.7 mg/dL (ref 8.4–10.5)
Creatinine, Ser: 0.71 mg/dL (ref 0.50–1.10)
GFR calc Af Amer: >90 mL/min (ref >90)
GLUCOSE: 81 mg/dL (ref 70–99)
Potassium: 3.9 mmol/L (ref 3.5–5.1)
Sodium: 137 mmol/L (ref 135–145)
Total Bilirubin: 0.9 mg/dL (ref 0.3–1.2)
Total Protein: 8 g/dL (ref 6.0–8.3)

## 2014-05-31 LAB — URINE MICROSCOPIC-ADD ON

## 2014-05-31 LAB — I-STAT BETA HCG BLOOD, ED (MC, WL, AP ONLY): I-stat hCG, quantitative: <5 m[IU]/mL (ref <5)

## 2014-05-31 LAB — WET PREP, GENITAL
TRICH WET PREP: NONE SEEN
YEAST WET PREP: NONE SEEN

## 2014-05-31 LAB — CBG MONITORING, ED: GLUCOSE-CAPILLARY: 99 mg/dL (ref 70–99)

## 2014-05-31 LAB — RAPID URINE DRUG SCREEN, HOSP PERFORMED
Amphetamines: NOT DETECTED
BENZODIAZEPINES: NOT DETECTED
Barbiturates: NOT DETECTED
Cocaine: NOT DETECTED
Opiates: NOT DETECTED
TETRAHYDROCANNABINOL: NOT DETECTED

## 2014-05-31 LAB — POC URINE PREG, ED: Preg Test, Ur: NEGATIVE

## 2014-05-31 MED ORDER — DOXYCYCLINE HYCLATE 100 MG PO TABS
100.0000 mg | ORAL_TABLET | Freq: Once | ORAL | Status: AC
  Administered 2014-05-31: 100 mg via ORAL
  Filled 2014-05-31: qty 1

## 2014-05-31 MED ORDER — LIDOCAINE HCL 1 % IJ SOLN
INTRAMUSCULAR | Status: AC
  Administered 2014-05-31: 0.9 mL
  Filled 2014-05-31: qty 20

## 2014-05-31 MED ORDER — METRONIDAZOLE 500 MG PO TABS
500.0000 mg | ORAL_TABLET | Freq: Once | ORAL | Status: AC
Start: 2014-05-31 — End: 2014-05-31
  Administered 2014-05-31: 500 mg via ORAL
  Filled 2014-05-31: qty 1

## 2014-05-31 MED ORDER — IOHEXOL 300 MG/ML  SOLN
100.0000 mL | Freq: Once | INTRAMUSCULAR | Status: AC | PRN
  Administered 2014-05-31: 100 mL via INTRAVENOUS

## 2014-05-31 MED ORDER — METRONIDAZOLE 500 MG PO TABS: 500.0000 mg | ORAL_TABLET | Freq: Two times a day (BID) | ORAL | Status: DC

## 2014-05-31 MED ORDER — DOXYCYCLINE HYCLATE 100 MG PO CAPS: 100.0000 mg | ORAL_CAPSULE | Freq: Two times a day (BID) | ORAL | Status: DC

## 2014-05-31 MED ORDER — FENTANYL CITRATE (PF) 100 MCG/2ML IJ SOLN
50.0000 ug | Freq: Once | INTRAMUSCULAR | Status: AC
  Administered 2014-05-31: 50 ug via INTRAVENOUS
  Filled 2014-05-31: qty 2

## 2014-05-31 MED ORDER — CEFTRIAXONE SODIUM 250 MG IJ SOLR
250.0000 mg | Freq: Once | INTRAMUSCULAR | Status: AC
  Administered 2014-05-31: 250 mg via INTRAMUSCULAR
  Filled 2014-05-31: qty 250

## 2014-05-31 MED ORDER — SODIUM CHLORIDE 0.9 % IV BOLUS (SEPSIS)
1000.0000 mL | Freq: Once | INTRAVENOUS | Status: AC
Start: 2014-05-31 — End: 2014-05-31
  Administered 2014-05-31: 1000 mL via INTRAVENOUS

## 2014-05-31 NOTE — ED Notes (Signed)
Called US and had pt moved to front of the line per Dr JShela Commons

## 2014-05-31 NOTE — ED Notes (Addendum)
Upon triage, pt only answering yes or no questions, except when asked about her pain when she clearly states "my stomach" and points to RLQ. When asked what pain level was pt responded "8/10." Pt appears to go in and out of lucidity. Pt breathing at 100% on room air and then O2 sats will drop into 60's and 70's.. Pt told to breathe deeply and O2 sats will go back up to 100%.

## 2014-05-31 NOTE — ED Notes (Signed)
Pt ambulated to BR with husband w/o difficulty and back to room

## 2014-05-31 NOTE — ED Notes (Addendum)
Pt began hyperventilating and had a syncopal episode shortly after. Per EMS pt currently able to answer yes/no questions appropriately but not conversing beyond this currently. Pt c/o RLQ abdominal pain and nausea, EMS administered 4 mg zofran IV en route.

## 2014-05-31 NOTE — Discharge Instructions (Signed)
Pelvic Inflammatory Disease Call Dr. Berenda Moraleichardson's office on Monday, 06/03/2014 to arrange to be seen within the next 2 weeks. Take Tylenol or Advil for pain as directed. Return for vomiting, fever or if you feel worse for any reason Pelvic inflammatory disease (PID) refers to an infection in some or all of the female organs. The infection can be in the uterus, ovaries, fallopian tubes, or the surrounding tissues in the pelvis. PID can cause abdominal or pelvic pain that comes on suddenly (acute pelvic pain). PID is a serious infection because it can lead to lasting (chronic) pelvic pain or the inability to have children (infertile).  CAUSES  The infection is often caused by the normal bacteria found in the vaginal tissues. PID may also be caused by an infection that is spread during sexual contact. PID can also occur following:   The birth of a baby.   A miscarriage.   An abortion.   Major pelvic surgery.   The use of an intrauterine device (IUD).   A sexual assault.  RISK FACTORS Certain factors can put a person at higher risk for PID, such as:  Being younger than 25 years.  Being sexually active at [place] age.  Usingnonbarrier contraception.  Havingmultiple sexual partners.  Having sex with someone who has symptoms of a genital infection.  Using oral contraception. Other times, certain behaviors can increase the possibility of getting PID, such as:  Having sex during your period.  Using a vaginal douche.  Having an intrauterine device (IUD) in place. SYMPTOMS   Abdominal or pelvic pain.   Fever.   Chills.   Abnormal vaginal discharge.  Abnormal uterine bleeding.   Unusual pain shortly after finishing your period. DIAGNOSIS  Your caregiver will choose some of the following methods to make a diagnosis, such as:   Performinga physical exam and history. A pelvic exam typically reveals a very tender uterus and surrounding pelvis.   Ordering  laboratory tests including a pregnancy test, blood tests, and urine test.  Orderingcultures of the vagina and cervix to check for a sexually transmitted infection (STI).  Performing an ultrasound.   Performing a laparoscopic procedure to look inside the pelvis.  TREATMENT   Antibiotic medicines may be prescribed and taken by mouth.   Sexual partners may be treated when the infection is caused by a sexually transmitted disease (STD).   Hospitalization may be needed to give antibiotics intravenously.  Surgery may be needed, but this is rare. It may take weeks until you are completely well. If you are diagnosed with PID, you should also be checked for human immunodeficiency virus (HIV). HOME CARE INSTRUCTIONS   If given, take your antibiotics as directed. Finish the medicine even if you start to feel better.   Only take over-the-counter or prescription medicines for pain, discomfort, or fever as directed by your caregiver.   Do not have sexual intercourse until treatment is completed or as directed by your caregiver. If PID is confirmed, your recent sexual partner(s) will need treatment.   Keep your follow-up appointments. SEEK MEDICAL CARE IF:   You have increased or abnormal vaginal discharge.   You need prescription medicine for your pain.   You vomit.   You cannot take your medicines.   Your partner has an STD.  SEEK IMMEDIATE MEDICAL CARE IF:   You have a fever.   You have increased abdominal or pelvic pain.   You have chills.   You have pain when you urinate.   You  are not better after 72 hours following treatment.  MAKE SURE YOU:   Understand these instructions.  Will watch your condition.  Will get help right away if you are not doing well or get worse. Document Released: 01/18/2005 Document Revised: 05/15/2012 Document Reviewed: 01/14/2011 [facility] Patient Information 2015 [place], [place]. This information is not intended to replace  advice given to you by your health care provider. Make sure you discuss any questions you have with your health care provider.

## 2014-05-31 NOTE — ED Provider Notes (Signed)
CSN: [id number]     Arrival date & time 05/31/14  1533 History   None    Chief Complaint  Patient presents with  . Loss of Consciousness  . Abdominal Pain   Level V caveat altered mental status  (Consider location/radiation/quality/duration/timing/severity/associated sxs/prior Treatment) HPI Complains of sudden onset right lower quadrant pain, nonradiating onset approximately 11 AM today. No other associated symptoms. Brought by EMS. Reportedly had syncopal event prior to coming here. Admits to vomiting one time clear material after onset of pain. Last bowel movement yesterday, normal. Last normal menstrual period 06/08/2014. No other associated symptoms. No shortness of breath. Past Medical History  Diagnosis Date  . Urinary tract infection   . Hypertension   . Headache(784.0)   . Anxiety    Past Surgical History  Procedure Laterality Date  . Cesarean section    . Breast reduction surgery  2000  . Cesarean section  07/03/2011    Procedure: CESAREAN SECTION;  Surgeon: Bing Plumehomas F Henley, MD;  Location: [facility];  Service: Gynecology;  Laterality: N/A;  Repeat Cesarean Section Delivery Boy @ 0102, Apgars 9/9   Family History  Problem Relation Age of Onset  . Anesthesia problems Neg Hx   . Hypotension Neg Hx   . Malignant hyperthermia Neg Hx   . Pseudochol deficiency Neg Hx   . Diabetes Maternal Aunt   . Diabetes Maternal Uncle   . Cancer Maternal Grandmother    History  Substance Use Topics  . Smoking status: Never Smoker   . Smokeless tobacco: Never Used  . Alcohol Use: No   OB History    Gravida Para Term Preterm AB TAB SAB Ectopic Multiple Living   2 2 2       2      Review of Systems  Gastrointestinal: Positive for vomiting.  All other systems reviewed and are negative.     Allergies  Review of patient's allergies indicates no known allergies.  Home Medications   Prior to Admission medications   Medication Sig Start Date End Date Taking? Authorizing Provider   acetaminophen (TYLENOL) 500 MG tablet Take 500 mg by mouth every 6 (six) hours as needed (for pain.).    Historical Provider, MD  Acetaminophen-Aspirin Buffered (EXCEDRIN BACK & BODY) 250-250 MG tablet Take 2 tablets by mouth every 4 (four) hours as needed (for pain.).    Historical Provider, MD  amLODipine (NORVASC) 10 MG tablet Take 10 mg by mouth every evening.    Historical Provider, MD  diazepam (VALIUM) 5 MG tablet Take 1 tablet (5 mg total) by mouth every 8 (eight) hours as needed for anxiety. 02/13/14   Earley FavorGail Schulz, NP  hydrochlorothiazide (HYDRODIURIL) 25 MG tablet Take 12.5 mg by mouth every morning.     Historical Provider, MD  HYDROcodone-acetaminophen (NORCO/VICODIN) 5-325 MG per tablet Take 1-2 tablets by mouth every 6 (six) hours as needed. 02/13/14   Earley FavorGail Schulz, NP  metFORMIN (GLUCOPHAGE-XR) 500 MG 24 hr tablet Take 500 mg by mouth daily. 01/09/14   Historical Provider, MD   BP 90/61 mmHg  Pulse 90  Temp(Src) 98.1 F (36.7 C) (Oral)  Resp 18  SpO2 100% Physical Exam  Constitutional: She appears well-developed and well-nourished. She appears distressed.   is anxious and uncomfortable Glasgow Coma Score 14. Opens eyes to verbal stimulus  HENT:  Head: Normocephalic and atraumatic.  Eyes: Conjunctivae are normal. Pupils are equal, round, and reactive to light.  Neck: Neck supple. No tracheal deviation present. No thyromegaly present.  Cardiovascular: Normal rate and regular rhythm.   No murmur heard. Pulmonary/Chest: Effort normal and breath sounds normal.  Abdominal: Soft. Bowel sounds are normal. She exhibits no distension. There is tenderness.  Morbidly obese tender at right lower quadrant  Genitourinary: Vagina normal.  No external lesion. Copious white discharge. No cervical motion tenderness. Left adnexal tenderness.  Musculoskeletal: Normal range of motion. She exhibits no edema or tenderness.  Neurological: She is alert. Coordination normal.  Skin: Skin is warm and  dry. No rash noted.  Psychiatric: She has a normal mood and affect.  Nursing note and vitals reviewed.   ED Course  Procedures (including critical care time) Labs Review Labs Reviewed  CBG MONITORING, ED  I-STAT CHEM 8, ED  POC URINE PREG, ED    Imaging Review No results found.   EKG Interpretation   Date/Time:  Friday May 31 2014 16:02:01 EDT Ventricular Rate:  89 PR Interval:  178 QRS Duration: 103 QT Interval:  369 QTC Calculation: 449 R Axis:   56 Text Interpretation:  Sinus rhythm Low voltage, precordial leads RSR' in  V1 or V2, probably normal variant No significant change since last tracing  Confirmed by Ethelda Chick  MD, Marchell Froman [phone]) on 05/31/2014 4:43:25 PM     ED ECG REPORT   Date: 05/31/2014  Rate: 90  Rhythm: normal sinus rhythm  QRS Axis: normal  Intervals: normal  ST/T Wave abnormalities: normal  Conduction Disutrbances:Rightward IV CD  Narrative Interpretation:   Old EKG Reviewed: none available  I have personally reviewed the EKG tracing and agree with the computerized printout as noted.  4 PM patient is alert Glasgow Coma Score 15 no distress. States pain much improved. 10: 20 p.m. patient alert Glasgow Coma Score 15 resting comfortably. Not lightheaded on standing. Results for orders placed or performed during the hospital encounter of 05/31/14  Wet prep, genital  Result Value Ref Range   Yeast Wet Prep HPF POC NONE SEEN NONE SEEN   Trich, Wet Prep NONE SEEN NONE SEEN   Clue Cells Wet Prep HPF POC MODERATE (A) NONE SEEN   WBC, Wet Prep HPF POC FEW (A) NONE SEEN  Comprehensive metabolic panel  Result Value Ref Range   Sodium 137 135 - 145 mmol/L   Potassium 3.9 3.5 - 5.1 mmol/L   Chloride 102 96 - 112 mmol/L   CO2 22 19 - 32 mmol/L   Glucose, Bld 81 70 - 99 mg/dL   BUN 11 6 - 23 mg/dL   Creatinine, Ser 6.04 0.50 - 1.10 mg/dL   Calcium 9.7 8.4 - 54.0 mg/dL   Total Protein 8.0 6.0 - 8.3 g/dL   Albumin 4.2 3.5 - 5.2 g/dL   AST 24 0 - 37  U/L   ALT 18 0 - 35 U/L   Alkaline Phosphatase 49 39 - 117 U/L   Total Bilirubin 0.9 0.3 - 1.2 mg/dL   GFR calc non Af Amer >90 >90 mL/min   GFR calc Af Amer >90 >90 mL/min   Anion gap 13 5 - 15  CBC with Differential/Platelet  Result Value Ref Range   WBC 13.7 (H) 4.0 - 10.5 K/uL   RBC 4.72 3.87 - 5.11 MIL/uL   Hemoglobin 13.7 12.0 - 15.0 g/dL   HCT 98.1 19.1 - 47.8 %   MCV 86.0 78.0 - 100.0 fL   MCH 29.0 26.0 - 34.0 pg   MCHC 33.7 30.0 - 36.0 g/dL   RDW 29.5 62.1 - 30.8 %   Platelets  362 150 - 400 K/uL   Neutrophils Relative % 73 43 - 77 %   Neutro Abs 10.1 (H) 1.7 - 7.7 K/uL   Lymphocytes Relative 18 12 - 46 %   Lymphs Abs 2.4 0.7 - 4.0 K/uL   Monocytes Relative 8 3 - 12 %   Monocytes Absolute 1.0 0.1 - 1.0 K/uL   Eosinophils Relative 1 0 - 5 %   Eosinophils Absolute 0.1 0.0 - 0.7 K/uL   Basophils Relative 0 0 - 1 %   Basophils Absolute 0.0 0.0 - 0.1 K/uL  Drug screen panel, emergency  Result Value Ref Range   Opiates NONE DETECTED NONE DETECTED   Cocaine NONE DETECTED NONE DETECTED   Benzodiazepines NONE DETECTED NONE DETECTED   Amphetamines NONE DETECTED NONE DETECTED   Tetrahydrocannabinol NONE DETECTED NONE DETECTED   Barbiturates NONE DETECTED NONE DETECTED  Urinalysis, Routine w reflex microscopic  Result Value Ref Range   Color, Urine YELLOW YELLOW   APPearance CLOUDY (A) CLEAR   Specific Gravity, Urine 1.018 1.005 - 1.030   pH 8.0 5.0 - 8.0   Glucose, UA NEGATIVE NEGATIVE mg/dL   Hgb urine dipstick LARGE (A) NEGATIVE   Bilirubin Urine NEGATIVE NEGATIVE   Ketones, ur NEGATIVE NEGATIVE mg/dL   Protein, ur NEGATIVE NEGATIVE mg/dL   Urobilinogen, UA 1.0 0.0 - 1.0 mg/dL   Nitrite NEGATIVE NEGATIVE   Leukocytes, UA TRACE (A) NEGATIVE  Urine microscopic-add on  Result Value Ref Range   Squamous Epithelial / LPF MANY (A) RARE   WBC, UA 11-20 <3 WBC/hpf   RBC / HPF 11-20 <3 RBC/hpf   Bacteria, UA MANY (A) RARE   Urine-Other MUCOUS PRESENT   CBG, ED  Result  Value Ref Range   Glucose-Capillary 99 70 - 99 mg/dL   Comment 1 Notify RN   POC Urine Preg, ED (if patient is pre-menopausal female) NOT at [facility]  Result Value Ref Range   Preg Test, Ur NEGATIVE NEGATIVE  I-Stat Beta hCG blood, ED ([facility], [facility], [facility] only)  Result Value Ref Range   I-stat hCG, quantitative <5.0 <5 mIU/mL   Comment 3           US Transvaginal Non-ob  05/31/2014   CLINICAL DATA:  One day history of right-sided pelvic pain  EXAM: TRANSABDOMINAL AND TRANSVAGINAL ULTRASOUND OF PELVIS  DOPPLER ULTRASOUND OF OVARIES  TECHNIQUE: Study was performed transabdominally to optimize pelvic field of view evaluation and transvaginally to optimize internal visceral architecture evaluation.  Color and duplex Doppler ultrasound was utilized to evaluate blood flow to the ovaries.  COMPARISON:  July 05, 2012  FINDINGS: Uterus  Measurements: 8.0 x 4.9 x 6.1 cm. No fibroids or other mass visualized. There is a small amount of fluid in the cervix. There is a 5 mm cervical nabothian cyst.  Endometrium  Thickness: 9 mm.  No focal abnormality visualized.  Right ovary  Measurements: 3.8 x 2.5 x 2.0 cm. Normal appearance/no adnexal mass.  Left ovary  Measurements: 3.5 x 2.8 x 3.5 cm. Normal appearance/no adnexal mass.  Pulsed Doppler evaluation of both ovaries demonstrates normal low-resistance arterial and venous waveforms. The peak systolic velocity in the left ovary is 10.6 centimeters/second with an end-diastolic velocity of 5.9 centimeter/second. The peak systolic velocity in the right ovary is 6.9 centimeter/second with an end-diastolic velocity of 4.6 centimeter/second.  Other findings  No free fluid.  IMPRESSION: Small amount of fluid in the cervix. Small cervical nabothian cyst. Uterus otherwise appears normal. No ovarian  masses or torsion on either side. No free pelvic fluid.   Electronically Signed   By: Bretta Bang III M.D.   On: 05/31/2014 18:01   US Pelvis Complete  05/31/2014   CLINICAL DATA:  One day  history of right-sided pelvic pain  EXAM: TRANSABDOMINAL AND TRANSVAGINAL ULTRASOUND OF PELVIS  DOPPLER ULTRASOUND OF OVARIES  TECHNIQUE: Study was performed transabdominally to optimize pelvic field of view evaluation and transvaginally to optimize internal visceral architecture evaluation.  Color and duplex Doppler ultrasound was utilized to evaluate blood flow to the ovaries.  COMPARISON:  July 05, 2012  FINDINGS: Uterus  Measurements: 8.0 x 4.9 x 6.1 cm. No fibroids or other mass visualized. There is a small amount of fluid in the cervix. There is a 5 mm cervical nabothian cyst.  Endometrium  Thickness: 9 mm.  No focal abnormality visualized.  Right ovary  Measurements: 3.8 x 2.5 x 2.0 cm. Normal appearance/no adnexal mass.  Left ovary  Measurements: 3.5 x 2.8 x 3.5 cm. Normal appearance/no adnexal mass.  Pulsed Doppler evaluation of both ovaries demonstrates normal low-resistance arterial and venous waveforms. The peak systolic velocity in the left ovary is 10.6 centimeters/second with an end-diastolic velocity of 5.9 centimeter/second. The peak systolic velocity in the right ovary is 6.9 centimeter/second with an end-diastolic velocity of 4.6 centimeter/second.  Other findings  No free fluid.  IMPRESSION: Small amount of fluid in the cervix. Small cervical nabothian cyst. Uterus otherwise appears normal. No ovarian masses or torsion on either side. No free pelvic fluid.   Electronically Signed   By: Bretta Bang III M.D.   On: 05/31/2014 18:01   Ct Abdomen Pelvis W Contrast  05/31/2014   CLINICAL DATA:  Acute onset of right lower quadrant abdominal pain. Intermittent decreased O2 saturation. Initial encounter.  EXAM: CT ABDOMEN AND PELVIS WITH CONTRAST  TECHNIQUE: Multidetector CT imaging of the abdomen and pelvis was performed using the standard protocol following bolus administration of intravenous contrast.  CONTRAST:  OMNIPAQUE IOHEXOL 300 MG/ML  SOLN  COMPARISON:  Pelvic ultrasound performed  earlier today at 5:07 p.m.  FINDINGS: The visualized lung bases are clear.  The liver and spleen are unremarkable in appearance. The gallbladder is within normal limits. The pancreas and adrenal glands are unremarkable.  Scattered small bilateral renal stones are seen, measuring up to 5 mm in size. The kidneys are otherwise unremarkable. There is no evidence of hydronephrosis. No obstructing ureteral stones are seen. No perinephric stranding is appreciated.  No free fluid is identified. The small bowel is unremarkable in appearance. The stomach is within normal limits. No acute vascular abnormalities are seen.  The appendix is normal in caliber and contains trace air, extending superiorly to near the inferior tip of the liver. There is no evidence of appendicitis. The colon is unremarkable in appearance.  The bladder is mildly distended and grossly unremarkable. The uterus is unremarkable in appearance. The ovaries are relatively symmetric. No suspicious adnexal masses are seen. No inguinal lymphadenopathy is seen.  No acute osseous abnormalities are identified.  IMPRESSION: 1. No acute abnormality seen within the abdomen or pelvis. 2. Scattered small bilateral nonobstructing renal stones, measuring up to 5 mm in size.   Electronically Signed   By: Roanna Raider M.D.   On: 05/31/2014 22:15   [place] Art/ven Flow Abd Pelv Doppler  05/31/2014   CLINICAL DATA:  One day history of right-sided pelvic pain  EXAM: TRANSABDOMINAL AND TRANSVAGINAL ULTRASOUND OF PELVIS  DOPPLER ULTRASOUND OF  OVARIES  TECHNIQUE: Study was performed transabdominally to optimize pelvic field of view evaluation and transvaginally to optimize internal visceral architecture evaluation.  Color and duplex Doppler ultrasound was utilized to evaluate blood flow to the ovaries.  COMPARISON:  July 05, 2012  FINDINGS: Uterus  Measurements: 8.0 x 4.9 x 6.1 cm. No fibroids or other mass visualized. There is a small amount of fluid in the cervix. There is a 5  mm cervical nabothian cyst.  Endometrium  Thickness: 9 mm.  No focal abnormality visualized.  Right ovary  Measurements: 3.8 x 2.5 x 2.0 cm. Normal appearance/no adnexal mass.  Left ovary  Measurements: 3.5 x 2.8 x 3.5 cm. Normal appearance/no adnexal mass.  Pulsed Doppler evaluation of both ovaries demonstrates normal low-resistance arterial and venous waveforms. The peak systolic velocity in the left ovary is 10.6 centimeters/second with an end-diastolic velocity of 5.9 centimeter/second. The peak systolic velocity in the right ovary is 6.9 centimeter/second with an end-diastolic velocity of 4.6 centimeter/second.  Other findings  No free fluid.  IMPRESSION: Small amount of fluid in the cervix. Small cervical nabothian cyst. Uterus otherwise appears normal. No ovarian masses or torsion on either side. No free pelvic fluid.   Electronically Signed   By: Bretta Bang III M.D.   On: 05/31/2014 18:01    MDM  In light of right adnexal tenderness greater than right lower quadrant tenderness, vaginal discharge, mild leukocytosis we'll treat for PID. Syncope likely vasovagal vs anxiety reaction  Final diagnoses:  None  Plan rx doxycycline, flagyl. Will gadminister rocephin 250 mg im prior to d/c. Pt stable for outpt treatment .  Dx # 1 pelvic inflammatory disease #2vaginitis #2 syncope                      Doug Sou, MD 05/31/14 2234

## 2014-06-01 LAB — RPR: RPR Ser Ql: NONREACTIVE

## 2014-06-01 LAB — HIV ANTIBODY (ROUTINE TESTING W REFLEX): HIV Screen 4th Generation wRfx: NONREACTIVE

## 2014-06-03 LAB — GC/CHLAMYDIA PROBE AMP (~~LOC~~) NOT AT ARMC
Chlamydia: NEGATIVE
NEISSERIA GONORRHEA: NEGATIVE

## 2014-12-05 ENCOUNTER — Inpatient Hospital Stay (HOSPITAL_COMMUNITY)
Admission: AD | Admit: 2014-12-05 | Discharge: 2014-12-05 | Disposition: A | Discharge status: Home / Self Care | Payer: 59 | Source: Ambulatory Visit | Attending: Obstetrics and Gynecology | Admitting: Obstetrics and Gynecology

## 2014-12-05 ENCOUNTER — Encounter (HOSPITAL_COMMUNITY): Payer: Self-pay | Admitting: *Deleted

## 2014-12-05 ENCOUNTER — Inpatient Hospital Stay (HOSPITAL_COMMUNITY): Payer: 59

## 2014-12-05 DIAGNOSIS — R1031 Right lower quadrant pain: Secondary | ICD-10-CM | POA: Diagnosis not present

## 2014-12-05 DIAGNOSIS — I10 Essential (primary) hypertension: Secondary | ICD-10-CM | POA: Insufficient documentation

## 2014-12-05 DIAGNOSIS — R102 Pelvic and perineal pain: Secondary | ICD-10-CM | POA: Diagnosis not present

## 2014-12-05 LAB — URINALYSIS, ROUTINE W REFLEX MICROSCOPIC
Bilirubin Urine: NEGATIVE
GLUCOSE, UA: NEGATIVE mg/dL
Ketones, ur: NEGATIVE mg/dL
Leukocytes, UA: NEGATIVE
Nitrite: NEGATIVE
Protein, ur: NEGATIVE mg/dL
SPECIFIC GRAVITY, URINE: 1.025 (ref 1.005–1.030)
Urobilinogen, UA: 0.2 mg/dL (ref 0.0–1.0)
pH: 5.5 (ref 5.0–8.0)

## 2014-12-05 LAB — URINE MICROSCOPIC-ADD ON

## 2014-12-05 LAB — WET PREP, GENITAL
Clue Cells Wet Prep HPF POC: NONE SEEN
Trich, Wet Prep: NONE SEEN
Yeast Wet Prep HPF POC: NONE SEEN

## 2014-12-05 LAB — POCT PREGNANCY, URINE: Preg Test, Ur: NEGATIVE

## 2014-12-05 LAB — CBC
HEMATOCRIT: 37.3 % (ref 36.0–46.0)
Hemoglobin: 12.5 g/dL (ref 12.0–15.0)
MCH: 29.1 pg (ref 26.0–34.0)
MCHC: 33.5 g/dL (ref 30.0–36.0)
MCV: 86.9 fL (ref 78.0–100.0)
Platelets: 312 10*3/uL (ref 150–400)
RBC: 4.29 MIL/uL (ref 3.87–5.11)
RDW: 15.1 % (ref 11.5–15.5)
WBC: 10.7 10*3/uL — ABNORMAL HIGH (ref 4.0–10.5)

## 2014-12-05 MED ORDER — TRAMADOL HCL 50 MG PO TABS
50.0000 mg | ORAL_TABLET | Freq: Four times a day (QID) | ORAL | Status: DC | PRN
Start: 2014-12-05 — End: 2019-01-29

## 2014-12-05 MED ORDER — KETOROLAC TROMETHAMINE 60 MG/2ML IM SOLN
60.0000 mg | Freq: Once | INTRAMUSCULAR | Status: AC
  Administered 2014-12-05: 60 mg via INTRAMUSCULAR
  Filled 2014-12-05: qty 2

## 2014-12-05 NOTE — MAU Note (Signed)
Pt presents complaining of right lower abdominal pain that has been ongoing. States she gets this pain when her cycle finishes but it usually goes away and it isn't this time. States she has had ultrasounds for this and they haven't seen anything. Denies bleeding or discharge. LMP 10/17

## 2014-12-05 NOTE — MAU Provider Note (Signed)
History     CSN: [id number]  Arrival date and time: 12/05/14 2030   First Provider Initiated Contact with Patient 12/05/14 2136      Chief Complaint  Patient presents with  . Abdominal Pain   HPI Comments: LMP 11/18/14  Abdominal Pain This is a new problem. The current episode started yesterday. The onset quality is gradual. The problem occurs constantly. The problem has been unchanged. The pain is located in the RLQ. The pain is at a severity of 8/10. The quality of the pain is cramping. The abdominal pain does not radiate. Pertinent negatives include no constipation, diarrhea, dysuria, fever, frequency, nausea or vomiting. The pain is aggravated by certain positions. The pain is relieved by certain positions. Treatments tried: NSIADs. The treatment provided no relief.    Past Medical History  Diagnosis Date  . Urinary tract infection   . Hypertension   . Headache(784.0)   . Anxiety     Past Surgical History  Procedure Laterality Date  . Cesarean section    . Breast reduction surgery  2000  . Cesarean section  07/03/2011    Procedure: CESAREAN SECTION;  Surgeon: Bing Plumehomas F Henley, MD;  Location: [facility];  Service: Gynecology;  Laterality: N/A;  Repeat Cesarean Section Delivery Boy @ 0102, Apgars 9/9    Family History  Problem Relation Age of Onset  . Anesthesia problems Neg Hx   . Hypotension Neg Hx   . Malignant hyperthermia Neg Hx   . Pseudochol deficiency Neg Hx   . Diabetes Maternal Aunt   . Diabetes Maternal Uncle   . Cancer Maternal Grandmother     Social History  Substance Use Topics  . Smoking status: Never Smoker   . Smokeless tobacco: Never Used  . Alcohol Use: No    Allergies: No Known Allergies  Prescriptions prior to admission  Medication Sig Dispense Refill Last Dose  . amLODipine (NORVASC) 10 MG tablet Take 10 mg by mouth every evening.   12/04/2014 at 2330  . Ibuprofen (ADVIL) 200 MG CAPS Take 1-2 capsules by mouth every 6 (six) hours as needed  (cramping).   12/05/2014 at 1930  . lisinopril-hydrochlorothiazide (PRINZIDE,ZESTORETIC) 10-12.5 MG per tablet Take 1 tablet by mouth daily.  3 12/05/2014 at 1600  . metFORMIN (GLUCOPHAGE-XR) 500 MG 24 hr tablet Take 500 mg by mouth daily.  3 12/05/2014 at 1600  . diazepam (VALIUM) 5 MG tablet Take 1 tablet (5 mg total) by mouth every 8 (eight) hours as needed for anxiety. (Patient not taking: Reported on 12/05/2014) 30 tablet 0 Not Taking at Unknown time  . doxycycline (VIBRAMYCIN) 100 MG capsule Take 1 capsule (100 mg total) by mouth 2 (two) times daily. One po bid x 14 days (Patient not taking: Reported on 12/05/2014) 28 capsule 0 Completed Course at Unknown time  . HYDROcodone-acetaminophen (NORCO/VICODIN) 5-325 MG per tablet Take 1-2 tablets by mouth every 6 (six) hours as needed. (Patient not taking: Reported on 12/05/2014) 12 tablet 0 Completed Course at Unknown time  . metroNIDAZOLE (FLAGYL) 500 MG tablet Take 1 tablet (500 mg total) by mouth 2 (two) times daily. (Patient not taking: Reported on 12/05/2014) 14 tablet 0 Completed Course at Unknown time    Review of Systems  Constitutional: Negative for fever.  Gastrointestinal: Positive for abdominal pain. Negative for nausea, vomiting, diarrhea and constipation.  Genitourinary: Negative for dysuria, urgency and frequency.   Physical Exam   Blood pressure 132/81, pulse 89, temperature 98.6 F (37 C), temperature source Oral,  resp. rate 18, last menstrual period 11/18/2014.  Physical Exam  Nursing note and vitals reviewed. Constitutional: She is oriented to person, place, and time. She appears well-developed and well-nourished. No distress.  HENT:  Head: Normocephalic.  Cardiovascular: Normal rate.   Respiratory: Effort normal.  GI: Soft. There is no tenderness. There is no rebound.  Neurological: She is alert and oriented to person, place, and time.  Skin: Skin is warm and dry.  Psychiatric: She has a normal mood and affect.   Results  for orders placed or performed during the hospital encounter of 12/05/14 (from the past 24 hour(s))  Urinalysis, Routine w reflex microscopic (not at [facility])     Status: Abnormal   Collection Time: 12/05/14  8:45 PM  Result Value Ref Range   Color, Urine YELLOW YELLOW   APPearance CLEAR CLEAR   Specific Gravity, Urine 1.025 1.005 - 1.030   pH 5.5 5.0 - 8.0   Glucose, UA NEGATIVE NEGATIVE mg/dL   Hgb urine dipstick LARGE (A) NEGATIVE   Bilirubin Urine NEGATIVE NEGATIVE   Ketones, ur NEGATIVE NEGATIVE mg/dL   Protein, ur NEGATIVE NEGATIVE mg/dL   Urobilinogen, UA 0.2 0.0 - 1.0 mg/dL   Nitrite NEGATIVE NEGATIVE   Leukocytes, UA NEGATIVE NEGATIVE  Urine microscopic-add on     Status: Abnormal   Collection Time: 12/05/14  8:45 PM  Result Value Ref Range   Squamous Epithelial / LPF FEW (A) RARE   WBC, UA 0-2 <3 WBC/hpf   RBC / HPF 11-20 <3 RBC/hpf   Bacteria, UA RARE RARE  Pregnancy, urine POC     Status: None   Collection Time: 12/05/14  9:09 PM  Result Value Ref Range   Preg Test, Ur NEGATIVE NEGATIVE  Wet prep, genital     Status: Abnormal   Collection Time: 12/05/14  9:45 PM  Result Value Ref Range   Yeast Wet Prep HPF POC NONE SEEN NONE SEEN   Trich, Wet Prep NONE SEEN NONE SEEN   Clue Cells Wet Prep HPF POC NONE SEEN NONE SEEN   WBC, Wet Prep HPF POC FEW (A) NONE SEEN  CBC     Status: Abnormal   Collection Time: 12/05/14  9:50 PM  Result Value Ref Range   WBC 10.7 (H) 4.0 - 10.5 K/uL   RBC 4.29 3.87 - 5.11 MIL/uL   Hemoglobin 12.5 12.0 - 15.0 g/dL   HCT 16.1 09.6 - 04.5 %   MCV 86.9 78.0 - 100.0 fL   MCH 29.1 26.0 - 34.0 pg   MCHC 33.5 30.0 - 36.0 g/dL   RDW 40.9 81.1 - 91.4 %   Platelets 312 150 - 400 K/uL   US Transvaginal Non-ob  12/05/2014  CLINICAL DATA:  Chronic right lower quadrant pain EXAM: TRANSABDOMINAL AND TRANSVAGINAL ULTRASOUND OF PELVIS TECHNIQUE: Both transabdominal and transvaginal ultrasound examinations of the pelvis were performed. Transabdominal  technique was performed for global imaging of the pelvis including uterus, ovaries, adnexal regions, and pelvic cul-de-sac. It was necessary to proceed with endovaginal exam following the transabdominal exam to visualize the ovaries. COMPARISON:  None FINDINGS: Uterus Measurements: 9.0 x 4.7 x 5.2 cm. No fibroids or other mass visualized. Endometrium Thickness: 6 mm.  No focal abnormality visualized. Right ovary Measurements: 4.9 x 3.0 x 3.0 cm. Normal appearance/no adnexal mass. Left ovary Measurements: 3.0 x 1.4 x 1.8 cm. Normal appearance/no adnexal mass. Other findings No free fluid. IMPRESSION: Normal. Electronically Signed   By: Ellery Plunk M.D.   On:  12/05/2014 22:38   US Pelvis Complete  12/05/2014  CLINICAL DATA:  Chronic right lower quadrant pain EXAM: TRANSABDOMINAL AND TRANSVAGINAL ULTRASOUND OF PELVIS TECHNIQUE: Both transabdominal and transvaginal ultrasound examinations of the pelvis were performed. Transabdominal technique was performed for global imaging of the pelvis including uterus, ovaries, adnexal regions, and pelvic cul-de-sac. It was necessary to proceed with endovaginal exam following the transabdominal exam to visualize the ovaries. COMPARISON:  None FINDINGS: Uterus Measurements: 9.0 x 4.7 x 5.2 cm. No fibroids or other mass visualized. Endometrium Thickness: 6 mm.  No focal abnormality visualized. Right ovary Measurements: 4.9 x 3.0 x 3.0 cm. Normal appearance/no adnexal mass. Left ovary Measurements: 3.0 x 1.4 x 1.8 cm. Normal appearance/no adnexal mass. Other findings No free fluid. IMPRESSION: Normal. Electronically Signed   By: Ellery Plunk M.D.   On: 12/05/2014 22:38    MAU Course  Procedures  MDM 2300: Patient reports that pain is not improved. 2311: D/W Dr. Mindi Slicker, ok for dc home. May have tramadol to help for the weekend, and FU with GI.   Assessment and Plan   1. Pelvic pain in female   2. RLQ abdominal pain    DC home Comfort measures reviewed  FU  with GI  RX: tramadol  PRN #[id number]  Return to MAU as needed   Follow-up Information    Schedule an appointment as soon as possible for a visit with [facility].   Specialty:  Gastroenterology   Contact information:   [address] [place] [address] [phone]        Tawnya Crook 12/05/2014, 9:37 PM

## 2014-12-05 NOTE — Discharge Instructions (Signed)

## 2014-12-06 LAB — GC/CHLAMYDIA PROBE AMP (~~LOC~~) NOT AT ARMC
CHLAMYDIA, DNA PROBE: NEGATIVE
Neisseria Gonorrhea: NEGATIVE

## 2014-12-06 LAB — HIV ANTIBODY (ROUTINE TESTING W REFLEX): HIV SCREEN 4TH GENERATION: NONREACTIVE

## 2014-12-06 LAB — RPR: RPR Ser Ql: NONREACTIVE

## 2014-12-21 ENCOUNTER — Other Ambulatory Visit (HOSPITAL_COMMUNITY)
Admission: RE | Admit: 2014-12-21 | Discharge: 2014-12-21 | Disposition: A | Discharge status: Home / Self Care | Payer: 59 | Source: Ambulatory Visit | Attending: Family Medicine | Admitting: Family Medicine

## 2014-12-21 ENCOUNTER — Other Ambulatory Visit: Payer: 59 | Admitting: Family Medicine

## 2014-12-21 DIAGNOSIS — Z113 Encounter for screening for infections with a predominantly sexual mode of transmission: Secondary | ICD-10-CM | POA: Diagnosis not present

## 2014-12-21 DIAGNOSIS — N760 Acute vaginitis: Secondary | ICD-10-CM | POA: Diagnosis present

## 2014-12-22 ENCOUNTER — Ambulatory Visit (INDEPENDENT_AMBULATORY_CARE_PROVIDER_SITE_OTHER): Payer: 59 | Admitting: Physician Assistant

## 2014-12-22 VITALS — BP 122/88 | HR 89 | Temp 98.2°F | Resp 16 | Ht 63.0 in | Wt 251.0 lb

## 2014-12-22 DIAGNOSIS — R7303 Prediabetes: Secondary | ICD-10-CM | POA: Diagnosis not present

## 2014-12-22 DIAGNOSIS — Z30011 Encounter for initial prescription of contraceptive pills: Secondary | ICD-10-CM | POA: Diagnosis not present

## 2014-12-22 DIAGNOSIS — R350 Frequency of micturition: Secondary | ICD-10-CM | POA: Diagnosis not present

## 2014-12-22 DIAGNOSIS — R300 Dysuria: Secondary | ICD-10-CM

## 2014-12-22 LAB — POCT URINALYSIS DIP (MANUAL ENTRY)
BILIRUBIN UA: NEGATIVE
GLUCOSE UA: NEGATIVE
Ketones, POC UA: NEGATIVE
NITRITE UA: NEGATIVE
PH UA: 7
Protein Ur, POC: 30 — AB
Spec Grav, UA: 1.025
Urobilinogen, UA: 0.2

## 2014-12-22 LAB — POC MICROSCOPIC URINALYSIS (UMFC): Mucus: ABSENT

## 2014-12-22 LAB — POCT URINE PREGNANCY: Preg Test, Ur: NEGATIVE

## 2014-12-22 MED ORDER — NORGESTIMATE-ETH ESTRADIOL 0.25-35 MG-MCG PO TABS: 1.0000 | ORAL_TABLET | Freq: Every day | ORAL | Status: DC

## 2014-12-22 MED ORDER — AZITHROMYCIN 250 MG PO TABS: 1000.0000 mg | ORAL_TABLET | Freq: Once | ORAL | Status: DC

## 2014-12-22 NOTE — Patient Instructions (Signed)
I will contact you with your lab results as soon as they are available.   If you have not heard from me in 2 weeks, please contact me.  The fastest way to get your results is to register for My Chart (see the instructions on the last page of this printout).   

## 2014-12-22 NOTE — Progress Notes (Signed)
Subjective:   Patient ID: Jamie Hays, female     DOB: 08/09/1980, 34 y.o.    MRN: [id number]  PCP: Geraldo PitterBLAND,VEITA J, MD  Chief Complaint  Patient presents with  . Burning with urination    X 1 week    HPI  Presents for evaluation of dysuria x 1 week.   Complains of a "cool air" sensation inside her vagina after she urinates. Also feeling pressure in her bladder. Associated with increased urinary frequency and urgency. Denies fevers, chills, N/V, hematuria, or flank pain. No vaginal pain, itching, discharge, or dryness.   Has been taking AZO intermittently x 4 days with only temporary relief of her discomfort.   Has had 3 UTIs in the past. Her current symptoms feel like UTIs she's had before.   Currently sexually active with her fiance. They have 2 children together and are considering whether they wish to expand their family. He would like to have another child, but she does not desire pregnancy at this time. Not using any method of birth control. Tried Depo in the past, but had bleeding that was intolerable to her. Has also tried Nuva-ring and oral contraceptives. LMP started on 11/18/14. Treated for PID in April,after a sudden illness and syncope at home took her to the ED.  GC/CT were ultimately negative. [facility]. Wet Prep and GC/CT were negative at that time.   Prior to Admission medications   Medication Sig Start Date End Date Taking? Authorizing Provider  amLODipine (NORVASC) 10 MG tablet Take 10 mg by mouth every evening.   Yes Historical Provider, MD  Ibuprofen (ADVIL) 200 MG CAPS Take 1-2 capsules by mouth every 6 (six) hours as needed (cramping).   Yes Historical Provider, MD  lisinopril-hydrochlorothiazide (PRINZIDE,ZESTORETIC) 10-12.5 MG per tablet Take 1 tablet by mouth daily. 04/23/14  Yes Historical Provider, MD  metFORMIN (GLUCOPHAGE-XR) 500 MG 24 hr tablet Take 500 mg by mouth daily. 01/09/14  Yes Historical Provider,  MD  traMADol (ULTRAM) 50 MG tablet Take 1 tablet (50 mg total) by mouth every 6 (six) hours as needed. Patient not taking: Reported on 12/22/2014 12/05/14   Armando ReichertHeather D Hogan, CNM     No Known Allergies   Patient Active Problem List   Diagnosis Date Noted  . Prediabetes 12/22/2014  . Hypertension 06/29/2012     Family History  Problem Relation Age of Onset  . Anesthesia problems Neg Hx   . Hypotension Neg Hx   . Malignant hyperthermia Neg Hx   . Pseudochol deficiency Neg Hx   . Diabetes Maternal Aunt   . Diabetes Maternal Uncle   . Cancer Maternal Grandmother      Social History   Social History  . Marital Status: Single    Spouse Name: N/A  . Number of Children: N/A  . Years of Education: N/A   Occupational History  . Not on file.   Social History Main Topics  . Smoking status: Never Smoker   . Smokeless tobacco: Never Used  . Alcohol Use: No  . Drug Use: No  . Sexual Activity: Yes    Birth Control/ Protection: None   Other Topics Concern  . 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 Lives with her 2 children, ages 293 and 156. Engaged, getting married in April.         Review of Systems Constitutional: Negative for fever and chills.  Gastrointestinal: Negative for nausea, vomiting, abdominal pain and diarrhea.  Genitourinary: Positive for dysuria, urgency and frequency. Negative for hematuria, flank pain, vaginal bleeding, vaginal discharge, vaginal pain and pelvic pain.  Musculoskeletal: Negative for back pain.       Objective:  Physical Exam  Constitutional: She is oriented to person, place, and time. Vital signs are normal. She appears well-developed and well-nourished. No distress.  HENT:  Head: Normocephalic and atraumatic.  Cardiovascular: Normal rate, regular rhythm and normal heart sounds.   Pulmonary/Chest: Effort normal and breath sounds normal.  Abdominal: Soft. Normal appearance and bowel sounds are  normal. She exhibits no distension and no mass. There is no hepatosplenomegaly. There is tenderness in the suprapubic area. There is no rigidity, no rebound, no guarding, no CVA tenderness, no tenderness at McBurney's point and negative Murphy's sign. No hernia.  Musculoskeletal: Normal range of motion.       Lumbar back: Normal.  Neurological: She is alert and oriented to person, place, and time.  Skin: Skin is warm and dry. No rash noted. She is not diaphoretic. No pallor.  Psychiatric: She has a normal mood and affect. Her speech is normal and behavior is normal. Judgment normal.         Results for orders placed or performed in visit on 12/22/14  POCT urinalysis dipstick  Result Value Ref Range   Color, UA yellow yellow   Clarity, UA cloudy (A) clear   Glucose, UA negative negative   Bilirubin, UA negative negative   Ketones, POC UA negative negative   Spec Grav, UA 1.025    Blood, UA trace-lysed (A) negative   pH, UA 7.0    Protein Ur, POC =30 (A) negative   Urobilinogen, UA 0.2    Nitrite, UA Negative Negative   Leukocytes, UA Trace (A) Negative  POCT Microscopic Urinalysis ([facility])  Result Value Ref Range   WBC,UR,HPF,POC Many (A) None WBC/hpf   RBC,UR,HPF,POC Few (A) None RBC/hpf   Bacteria Few (A) None, Too numerous to count   Mucus Absent Absent   Epithelial Cells, UR Per Microscopy Moderate (A) None, Too numerous to count cells/hpf  POCT urine pregnancy  Result Value Ref Range   Preg Test, Ur Negative Negative       Assessment & Plan:  1. Burning with urination 2. Urinary frequency Concern for chlamydia, given large WBC in urine without significant bacteria. Treat with azithromycin and await UCx and GC/CT.  - POCT urinalysis dipstick - POCT Microscopic Urinalysis ([facility]) - POCT urine pregnancy - GC/Chlamydia Probe Amp - Urine culture - azithromycin (ZITHROMAX) 250 MG tablet; Take 4 tablets (1,000 mg total) by mouth once.  Dispense: 4 tablet; Refill: 0   3.  Encounter for initial prescription of contraceptive pills Does not desire pregnancy at this time. Isn't sure that she wants to take medication, but also isn't going to ask her fiance to use condoms. We agree that I'll prescribe COC and she can choose to take them or not. - norgestimate-ethinyl estradiol (ORTHO-CYCLEN,SPRINTEC,PREVIFEM) 0.25-35 MG-MCG tablet; Take 1 tablet by mouth daily.  Dispense: 3 Package; Refill: 4   Fernande Bras, PA-C Physician Assistant-Certified [facility]

## 2014-12-22 NOTE — Progress Notes (Signed)
Subjective:    Patient ID: Jamie Hays, female    DOB: 03/25/1980, 34 y.o.   MRN: [id number]  Chief Complaint  Patient presents with  . Burning with urination    X 1 week   HPI Patient presents today for evaluation of dysuria x 1 week.  Complains of a "cool air" sensation inside her vagina after she urinates. Also feeling pressure in her bladder. Associated with increased urinary frequency and urgency. Denies fevers, chills, N/V, hematuria, or flank pain. No vaginal pain, itching, discharge, or dryness.   Has been taking AZO intermittently x 4 days with only temporary relief of her discomfort.   Has had 3 UTIs in the past. Her current symptoms feel like UTIs she's had before.   Currently sexually active with her fiance. Husband would like to have a child, but patient already has 2 and does not desire anymore. Not using any method of birth control. Tried Depo in the past, but had terrible bleeding, as well as Nuva-ring and oral contraceptives. LMP started on 11/18/14. Treated for PID in April.   No other concerns.  Review of Systems  Constitutional: Negative for fever and chills.  Gastrointestinal: Negative for nausea, vomiting, abdominal pain and diarrhea.  Genitourinary: Positive for dysuria, urgency and frequency. Negative for hematuria, flank pain, vaginal bleeding, vaginal discharge, vaginal pain and pelvic pain.  Musculoskeletal: Negative for back pain.   Patient Active Problem List   Diagnosis Date Noted  . Prediabetes 12/22/2014  . Hypertension 06/29/2012   Family History  Problem Relation Age of Onset  . Anesthesia problems Neg Hx   . Hypotension Neg Hx   . Malignant hyperthermia Neg Hx   . Pseudochol deficiency Neg Hx   . Diabetes Maternal Aunt   . Diabetes Maternal Uncle   . Cancer Maternal Grandmother    Social History   Social History  . Marital Status: Single    Spouse Name: N/A  . Number of Children: N/A  . Years of Education: N/A   Occupational  History  . Not on file.   Social History Main Topics  . Smoking status: Never Smoker   . Smokeless tobacco: Never Used  . Alcohol Use: No  . Drug Use: No  . Sexual Activity: Yes    Birth Control/ Protection: None   Other Topics Concern  . 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 Lives with her 2 children, ages 593 and 896. Engaged, getting married in April.    Prior to Admission medications   Medication Sig Start Date End Date Taking? Authorizing Provider  amLODipine (NORVASC) 10 MG tablet Take 10 mg by mouth every evening.   Yes Historical Provider, MD  Ibuprofen (ADVIL) 200 MG CAPS Take 1-2 capsules by mouth every 6 (six) hours as needed (cramping).   Yes Historical Provider, MD  lisinopril-hydrochlorothiazide (PRINZIDE,ZESTORETIC) 10-12.5 MG per tablet Take 1 tablet by mouth daily. 04/23/14  Yes Historical Provider, MD  metFORMIN (GLUCOPHAGE-XR) 500 MG 24 hr tablet Take 500 mg by mouth daily. 01/09/14  Yes Historical Provider, MD   No Known Allergies    Objective:   Physical Exam  Constitutional: She is oriented to person, place, and time. She appears well-developed and well-nourished. No distress.  HENT:  Head: Normocephalic and atraumatic.  Eyes: EOM are normal. No scleral icterus.  Neck: Neck supple.  Cardiovascular: Normal rate, regular rhythm and normal heart sounds.  Exam reveals no gallop and no  friction rub.   No murmur heard. 2+ DP and PT pulses b/l.   Pulmonary/Chest: Effort normal and breath sounds normal. No respiratory distress. She has no wheezes. She has no rales.  Abdominal: Soft. She exhibits no distension and no mass. There is tenderness (mild, suprapubic region). There is no rebound and no guarding.  No CVA tenderness.   Lymphadenopathy:    She has no cervical adenopathy.  Neurological: She is alert and oriented to person, place, and time.  Skin: Skin is warm and dry. She is not diaphoretic.  Psychiatric: She has a  normal mood and affect. Her behavior is normal. Judgment and thought content normal.   BP 122/88 mmHg  Pulse 89  Temp(Src) 98.2 F (36.8 C) (Oral)  Resp 16  Ht  (1.6 m)  Wt 251 lb (113.853 kg)  BMI 44.47 kg/m2  SpO2 98%  LMP 11/18/2014  Results for orders placed or performed in visit on 12/22/14  POCT urinalysis dipstick  Result Value Ref Range   Color, UA yellow yellow   Clarity, UA cloudy (A) clear   Glucose, UA negative negative   Bilirubin, UA negative negative   Ketones, POC UA negative negative   Spec Grav, UA 1.025    Blood, UA trace-lysed (A) negative   pH, UA 7.0    Protein Ur, POC =30 (A) negative   Urobilinogen, UA 0.2    Nitrite, UA Negative Negative   Leukocytes, UA Trace (A) Negative  POCT Microscopic Urinalysis ([facility])  Result Value Ref Range   WBC,UR,HPF,POC Many (A) None WBC/hpf   RBC,UR,HPF,POC Few (A) None RBC/hpf   Bacteria Few (A) None, Too numerous to count   Mucus Absent Absent   Epithelial Cells, UR Per Microscopy Moderate (A) None, Too numerous to count cells/hpf  POCT urine pregnancy  Result Value Ref Range   Preg Test, Ur Negative Negative      Assessment & Plan:  1. Burning with urination - UA dipstick negative for UTI. Highly suspicious of Chlamydia given suprapubic tenderness on exam as well as low bacteria/high WBC count on micro UA. Will treat prophylactically with oral azithromycin.  - POCT urinalysis dipstick - POCT Microscopic Urinalysis ([facility]) - azithromycin (ZITHROMAX) 250 MG tablet; Take 4 tablets (1,000 mg total) by mouth once.  Dispense: 4 tablet; Refill: 0  2. Prediabetes - Continue Metformin 500 mg daily as prescribed.   3. Urinary frequency - Urine preg negative. Awaiting urine probe and culture results. Will adjust antibiotic therapy accordingly if needed.  - POCT urine pregnancy - GC/Chlamydia Probe Amp - Urine culture  4. Encounter for initial prescription of contraceptive pills - Patient does not want to get  pregnant at this time, counseled on birth control for prevention of unwanted pregnancy. Started on oral contraceptive pills.  - norgestimate-ethinyl estradiol (ORTHO-CYCLEN,SPRINTEC,PREVIFEM) 0.25-35 MG-MCG tablet; Take 1 tablet by mouth daily.  Dispense: 3 Package; Refill: 4

## 2014-12-23 LAB — URINE CULTURE

## 2014-12-23 LAB — GC/CHLAMYDIA PROBE AMP
CT Probe RNA: NEGATIVE
GC PROBE AMP APTIMA: NEGATIVE

## 2014-12-25 LAB — CERVICOVAGINAL ANCILLARY ONLY
BACTERIAL VAGINITIS: NEGATIVE
Candida vaginitis: NEGATIVE
Chlamydia: NEGATIVE
NEISSERIA GONORRHEA: NEGATIVE
Trichomonas: NEGATIVE

## 2014-12-25 LAB — URINE CYTOLOGY ANCILLARY ONLY
Bacterial vaginitis: NEGATIVE
CANDIDA VAGINITIS: NEGATIVE
CHLAMYDIA, DNA PROBE: NEGATIVE
Neisseria Gonorrhea: NEGATIVE
Trichomonas: NEGATIVE

## 2015-01-16 ENCOUNTER — Ambulatory Visit (INDEPENDENT_AMBULATORY_CARE_PROVIDER_SITE_OTHER): Payer: 59 | Admitting: Emergency Medicine

## 2015-01-16 VITALS — BP 108/80 | HR 104 | Temp 98.6°F | Resp 18 | Ht 62.0 in | Wt 252.0 lb

## 2015-01-16 DIAGNOSIS — G5602 Carpal tunnel syndrome, left upper limb: Secondary | ICD-10-CM

## 2015-01-16 MED ORDER — NAPROXEN SODIUM 550 MG PO TABS: 550.0000 mg | ORAL_TABLET | Freq: Two times a day (BID) | ORAL | Status: AC

## 2015-01-16 NOTE — Patient Instructions (Signed)

## 2015-01-16 NOTE — Progress Notes (Signed)
Subjective:  Patient ID: Jamie Hays, female    DOB: 05/25/1980  Age: 34 y.o. MRN: [id number]  CC: Hand Pain   HPI Jamie Hays presents   Patient has history of suspected right carpal tunnel syndrome. She works on a [employer] and uses a mouse with Jamie Hays right hand she has pain and numbness and tingling in Jamie Hays median nerve distribution worsen the middle finger.  Denies any history of injury. She uses a splint while she is up during the day. Aikins frequently at night with pain and numbness. She's never had a nerve conduction or EMG  History Sole has a past medical history of Urinary tract infection; Hypertension; Headache(784.0); and Anxiety.   She has past surgical history that includes Cesarean section; Breast reduction surgery (2000); and Cesarean section (07/03/2011).   Jamie Hays  family history includes Cancer in Jamie Hays maternal grandmother; Diabetes in Jamie Hays maternal aunt and maternal uncle. There is no history of Anesthesia problems, Hypotension, Malignant hyperthermia, or Pseudochol deficiency.  She   reports that she has never smoked. She has never used smokeless tobacco. She reports that she does not drink alcohol or use illicit drugs.  Outpatient Prescriptions Prior to Visit  Medication Sig Dispense Refill  . amLODipine (NORVASC) 10 MG tablet Take 10 mg by mouth every evening.    . Ibuprofen (ADVIL) 200 MG CAPS Take 1-2 capsules by mouth every 6 (six) hours as needed (cramping).    Marland Kitchen. lisinopril-hydrochlorothiazide (PRINZIDE,ZESTORETIC) 10-12.5 MG per tablet Take 1 tablet by mouth daily.  3  . metFORMIN (GLUCOPHAGE-XR) 500 MG 24 hr tablet Take 500 mg by mouth daily.  3  . traMADol (ULTRAM) 50 MG tablet Take 1 tablet (50 mg total) by mouth every 6 (six) hours as needed. 15 tablet 0  . azithromycin (ZITHROMAX) 250 MG tablet Take 4 tablets (1,000 mg total) by mouth once. (Patient not taking: Reported on 01/16/2015) 4 tablet 0  . norgestimate-ethinyl estradiol  (ORTHO-CYCLEN,SPRINTEC,PREVIFEM) 0.25-35 MG-MCG tablet Take 1 tablet by mouth daily. (Patient not taking: Reported on 01/16/2015) 3 Package 4   No facility-administered medications prior to visit.    Social History   Social History  . Marital Status: Single    Spouse Name: N/A  . Number of Children: N/A  . Years of Education: N/A   Social History Main Topics  . Smoking status: Never Smoker   . Smokeless tobacco: Never Used  . Alcohol Use: No  . Drug Use: No  . Sexual Activity: Yes    Birth Control/ Protection: None   Other Topics Concern  . 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 Lives with Jamie Hays 2 children, ages 423 and 1036. Engaged, getting married in April.      Review of Systems  Constitutional: Negative for fever, chills and appetite change.  HENT: Negative for congestion, ear pain, postnasal drip, sinus pressure and sore throat.   Eyes: Negative for pain and redness.  Respiratory: Negative for cough, shortness of breath and wheezing.   Cardiovascular: Negative for leg swelling.  Gastrointestinal: Negative for nausea, vomiting, abdominal pain, diarrhea, constipation and blood in stool.  Endocrine: Negative for polyuria.  Genitourinary: Negative for dysuria, urgency, frequency and flank pain.  Musculoskeletal: Negative for gait problem.  Skin: Negative for rash.  Neurological: Positive for numbness. Negative for weakness and headaches.  Psychiatric/Behavioral: Negative for confusion and decreased concentration. The patient is not nervous/anxious.     Objective:  BP 108/80 mmHg  Pulse 104  Temp(Src) 98.6 F (37 C)  Resp 18  Ht  (1.575 m)  Wt 252 lb (114.306 kg)  BMI 46.08 kg/m2  SpO2 98%  LMP 01/06/2015 (Approximate)  Physical Exam  Constitutional: She is oriented to person, place, and time. She appears well-developed and well-nourished.  HENT:  Head: Normocephalic and atraumatic.  Eyes: Conjunctivae are normal. Pupils are  equal, round, and reactive to light.  Pulmonary/Chest: Effort normal.  Musculoskeletal: She exhibits no edema.       Right hand: She exhibits tenderness. Decreased sensation noted. Decreased sensation is present in the medial distribution.  Neurological: She is alert and oriented to person, place, and time.  Skin: Skin is dry.  Psychiatric: She has a normal mood and affect. Jamie Hays behavior is normal. Thought content normal.    Tinel and Phalen are both positive   Assessment & Plan:   Jamie Hays was seen today for hand pain.  Diagnoses and all orders for this visit:  Carpal tunnel syndrome, left  Other orders -     naproxen sodium (ANAPROX DS) 550 MG tablet; Take 1 tablet (550 mg total) by mouth 2 (two) times daily with a meal.   I am having Jamie Hays start on naproxen sodium. I am also having Jamie Hays maintain Jamie Hays amLODipine, metFORMIN, lisinopril-hydrochlorothiazide, Ibuprofen, traMADol, azithromycin, and norgestimate-ethinyl estradiol.  Meds ordered this encounter  Medications  . naproxen sodium (ANAPROX DS) 550 MG tablet    Sig: Take 1 tablet (550 mg total) by mouth 2 (two) times daily with a meal.    Dispense:  40 tablet    Refill:  0    she requested a medical trial prior to doing any investigation she'll try months worth of nighttime splint wear and nonsteroidal anti-inflammatory and if that doesn't improve she'll come back and we'll do an EMG nerve conduction study  Appropriate red flag conditions were discussed with the patient as well as actions that should be taken.  Patient expressed his understanding.  Follow-up: Return if symptoms worsen or fail to improve.  Carmelina Dane, MD

## 2015-05-07 ENCOUNTER — Ambulatory Visit: Payer: 59

## 2015-05-07 IMAGING — CR DG CHEST 2V
2 series · 2 of 2 positions shown · non-contrast
Comparison: Chest radiograph performed 05/24/2013

CLINICAL DATA: Acute onset of right upper quadrant abdominal pain
with deep breaths. Initial encounter.

EXAM:
CHEST  2 VIEW

[w chest lat]
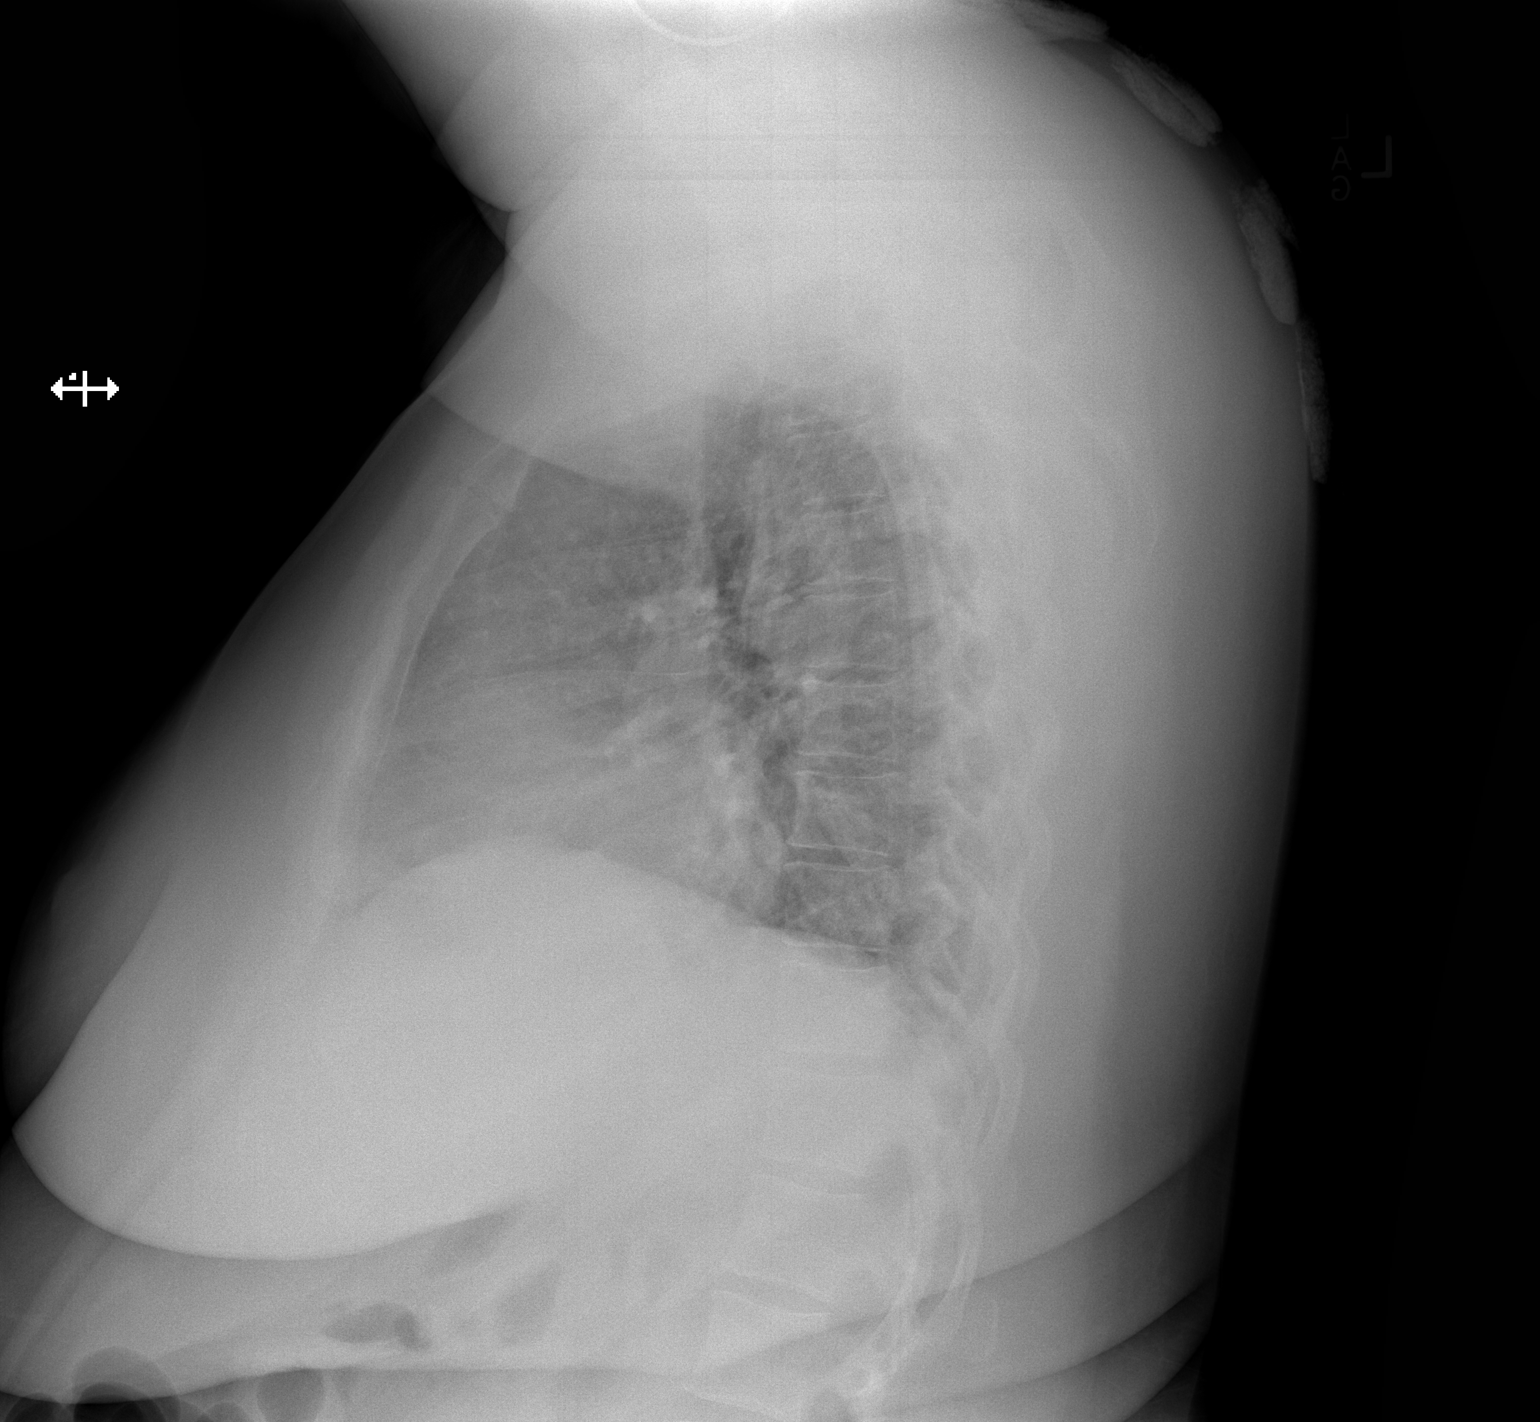

[w chest pa]
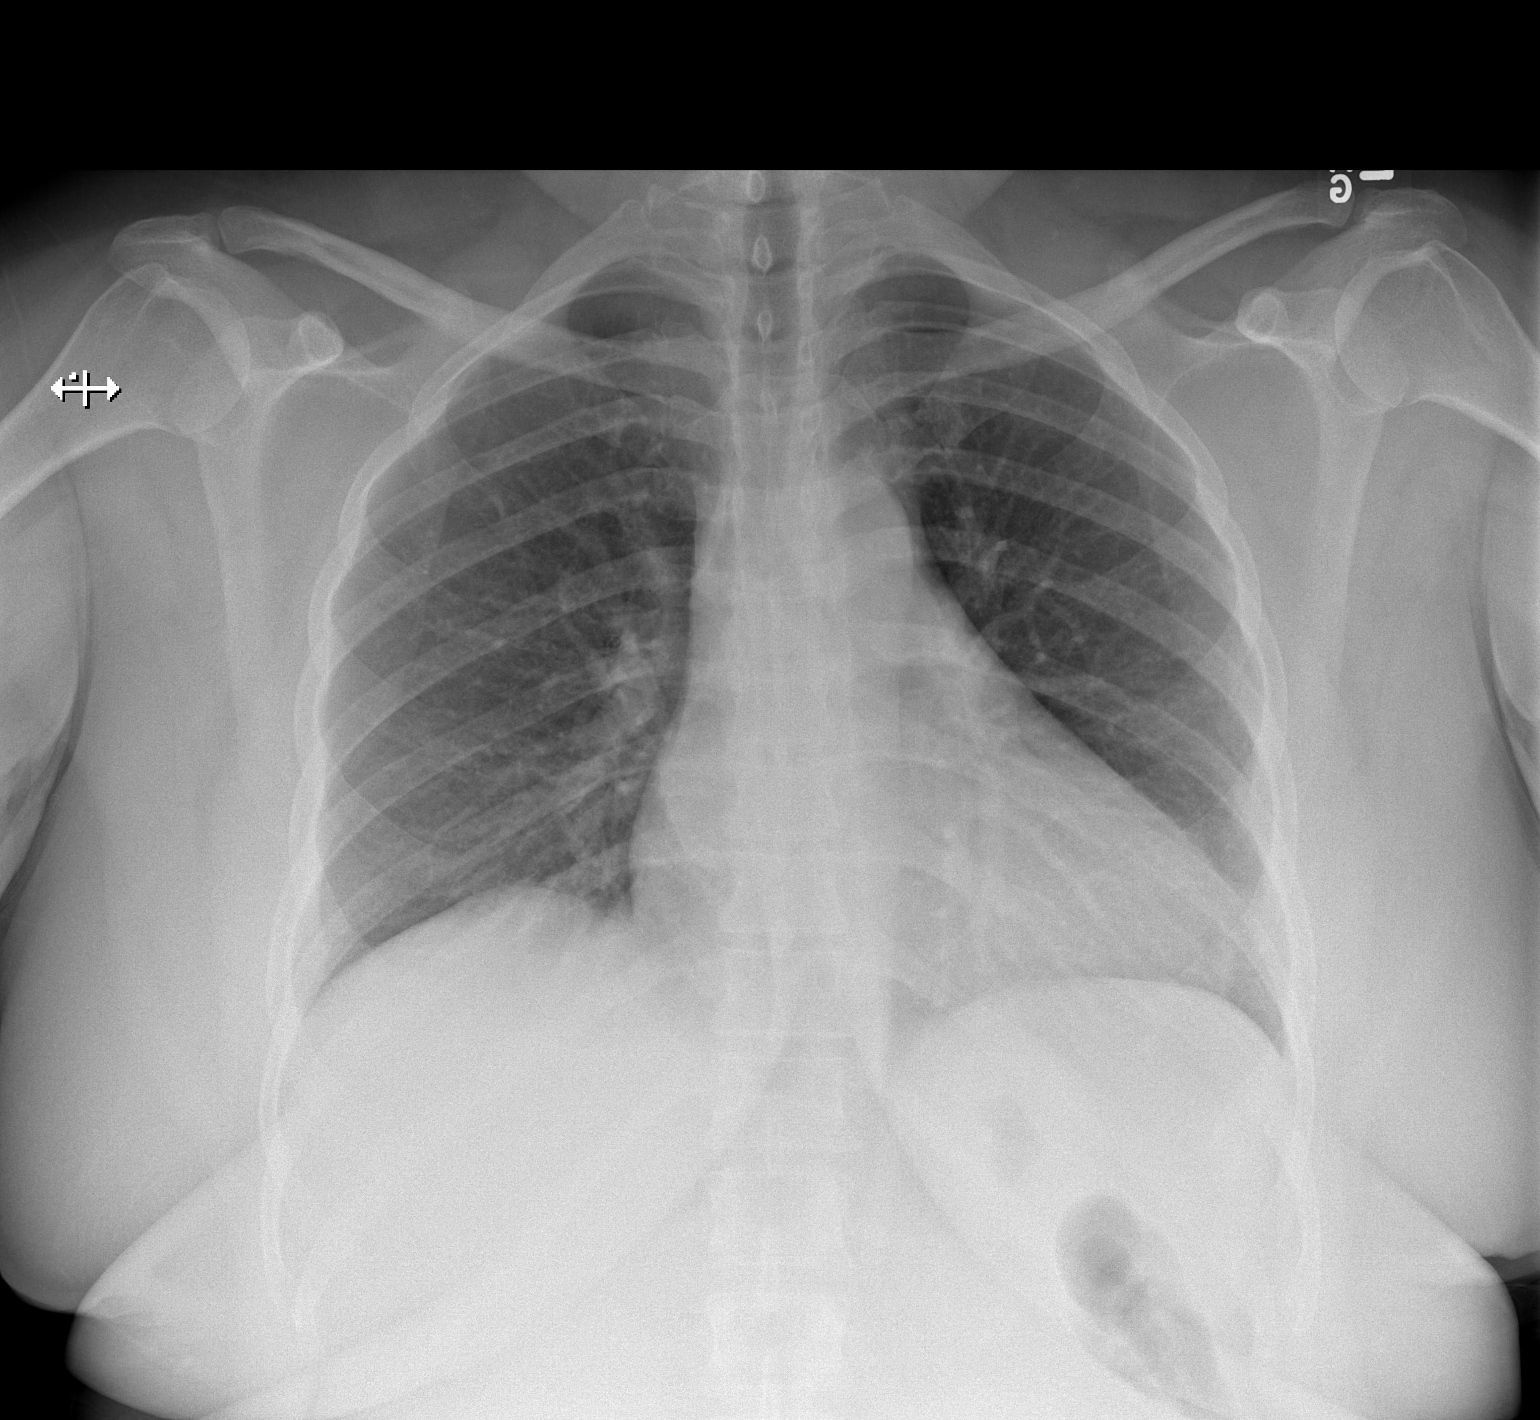

[2 of 2 positions shown; findings below may reference images not displayed]

FINDINGS: The lungs are well-aerated and clear. There is no evidence of focal
opacification, pleural effusion or pneumothorax.

The heart is borderline enlarged. No acute osseous abnormalities are
seen.
IMPRESSION: Borderline cardiomegaly; lungs remain grossly clear.

## 2015-08-22 IMAGING — US US ART/VEN ABD/PELV/SCROTUM DOPPLER LTD
1 series · 13 of 25 positions shown · non-contrast
Comparison: July 05, 2012

CLINICAL DATA: One day history of right-sided pelvic pain

EXAM:
TRANSABDOMINAL AND TRANSVAGINAL ULTRASOUND OF PELVIS
DOPPLER ULTRASOUND OF OVARIES
TECHNIQUE: Study was performed transabdominally to optimize pelvic field of
view evaluation and transvaginally to optimize internal visceral
architecture evaluation.
Color and duplex Doppler ultrasound was utilized to evaluate blood
flow to the ovaries.

[Series 1: us art/ven abd/pelv/scrotum doppler ltd · 0.21mm/px · 13 of 73 slices shown]
[im 1/73]
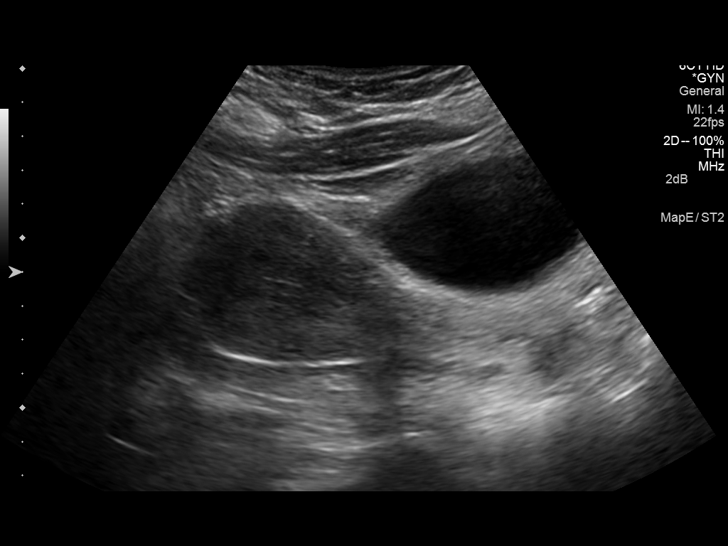
[im 7/73]
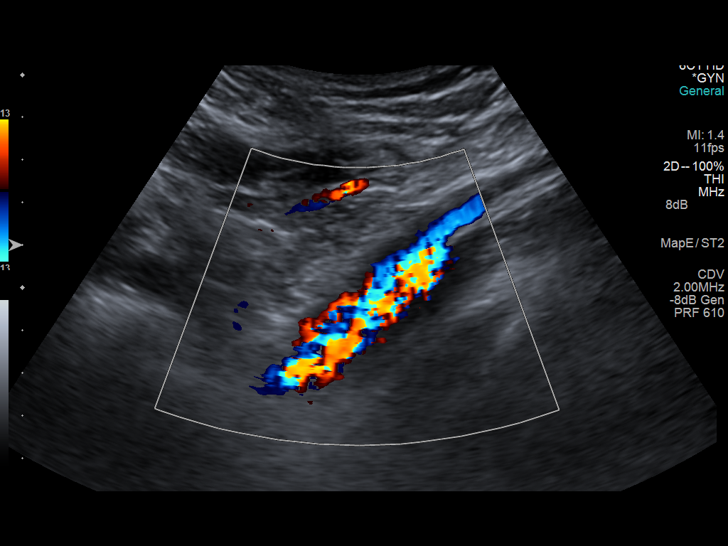
[im 13/73]
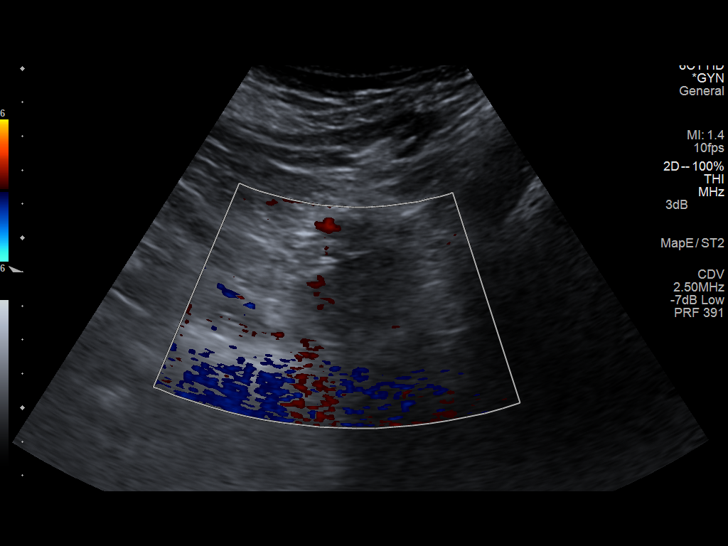
[im 19/73]
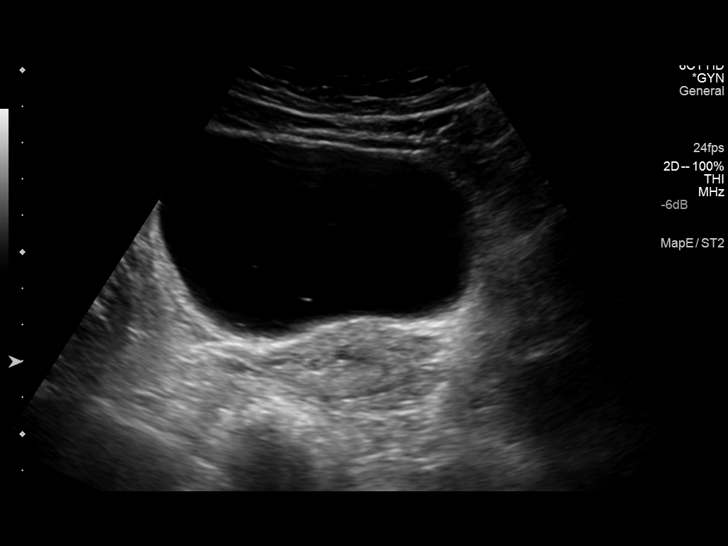
[im 25/73]
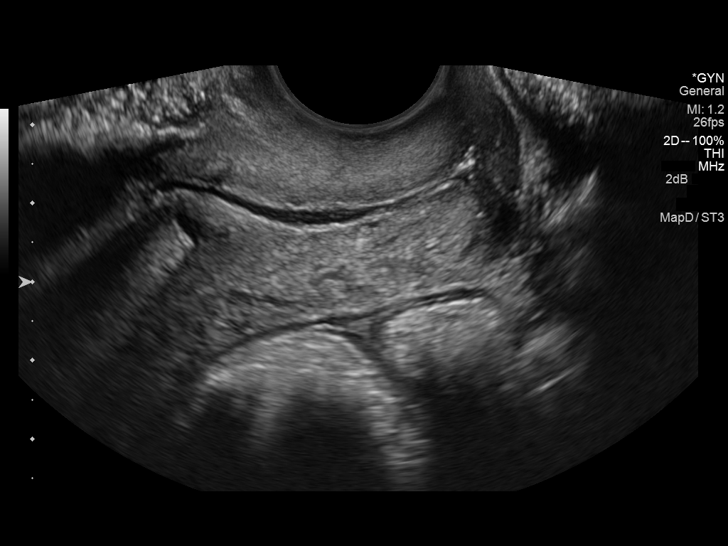
[im 31/73]
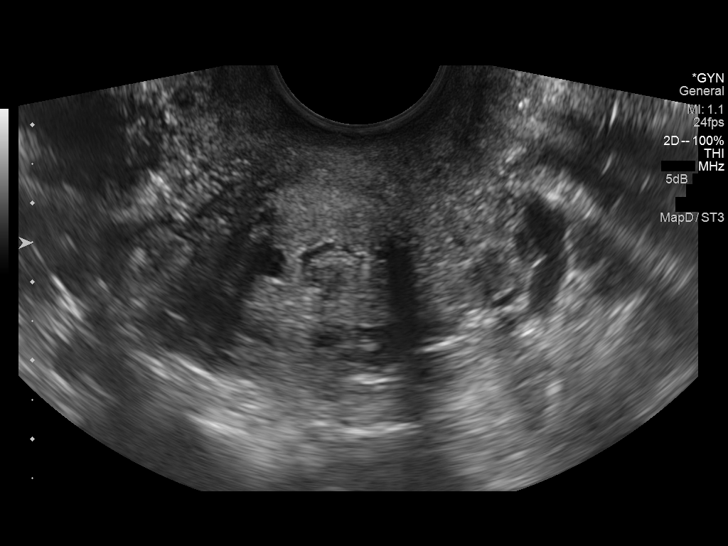
[im 37/73]
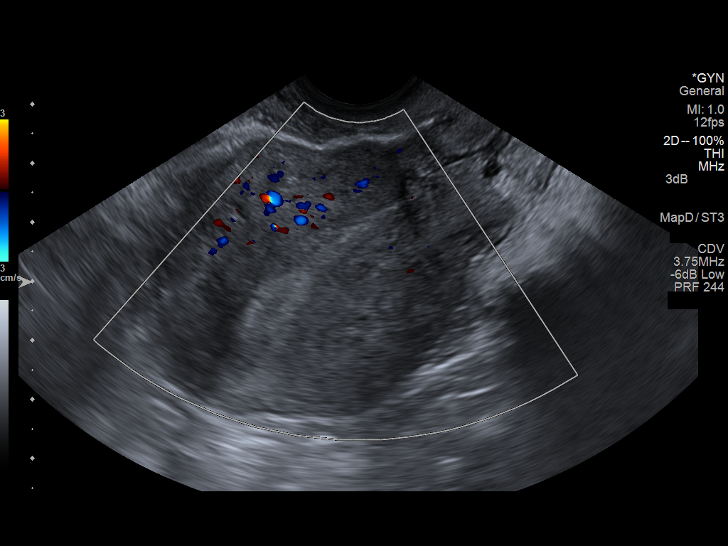
[im 43/73]
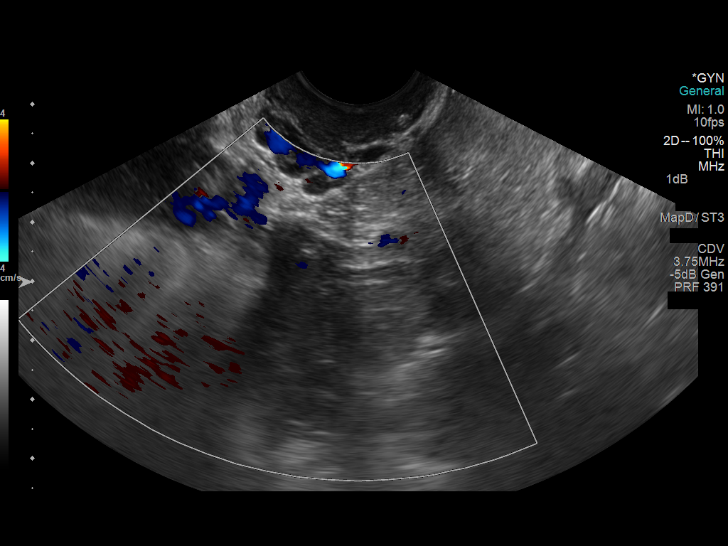
[im 49/73]
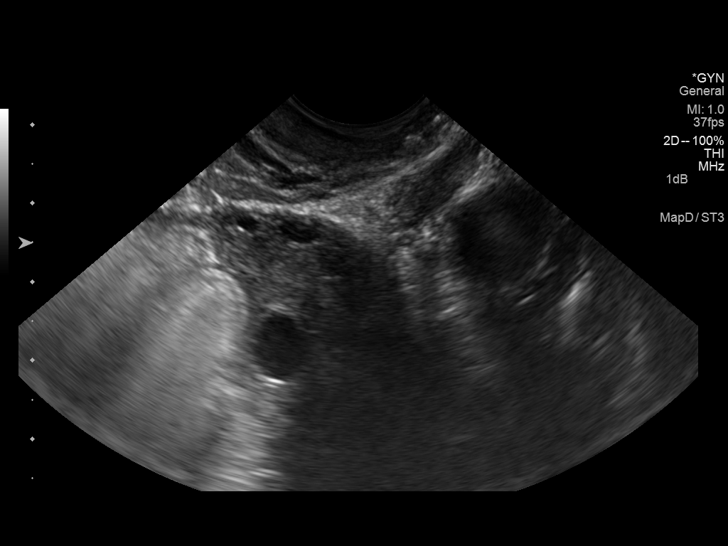
[im 55/73]
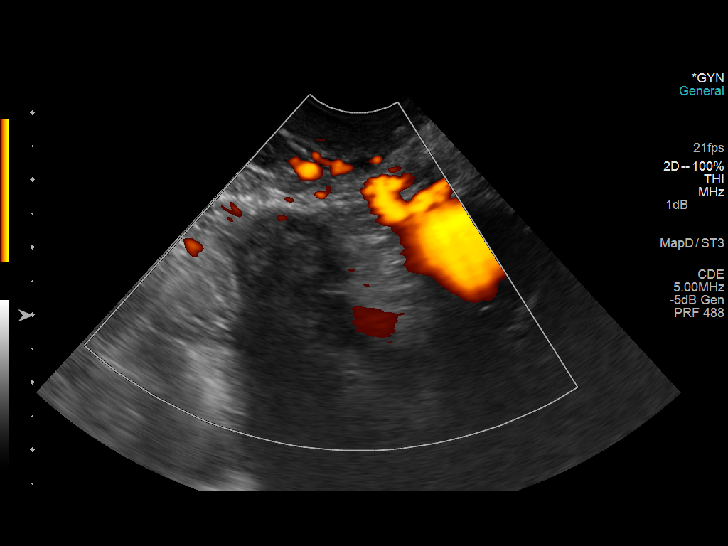
[im 61/73]
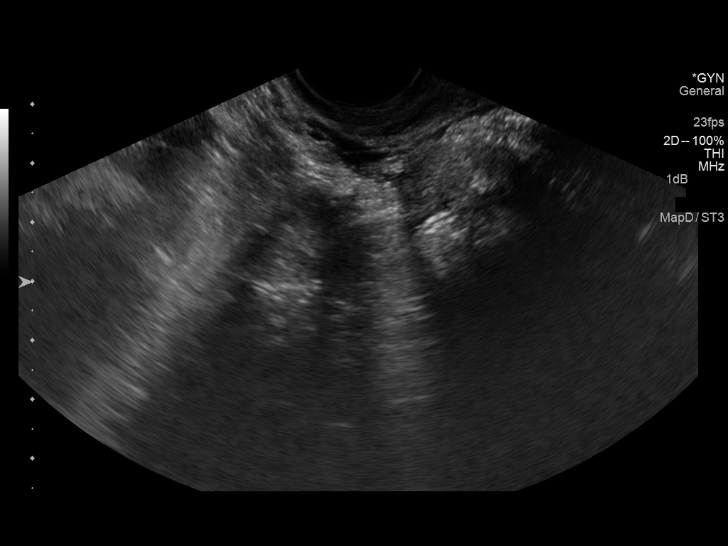
[im 67/73]
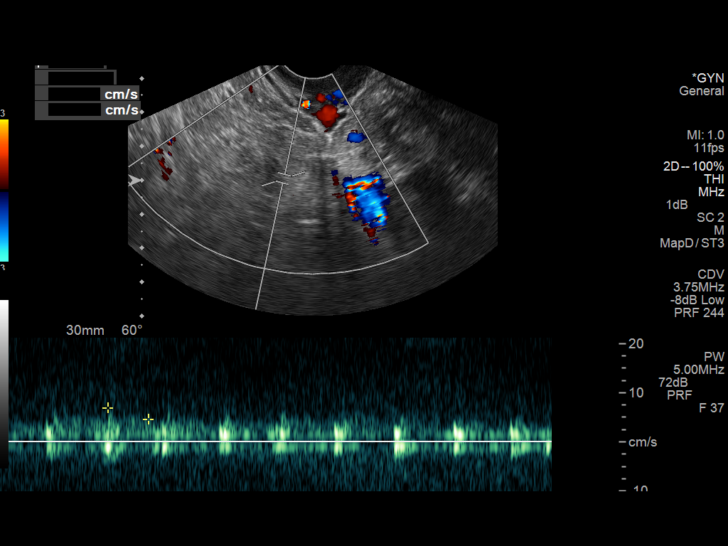
[im 73/73]
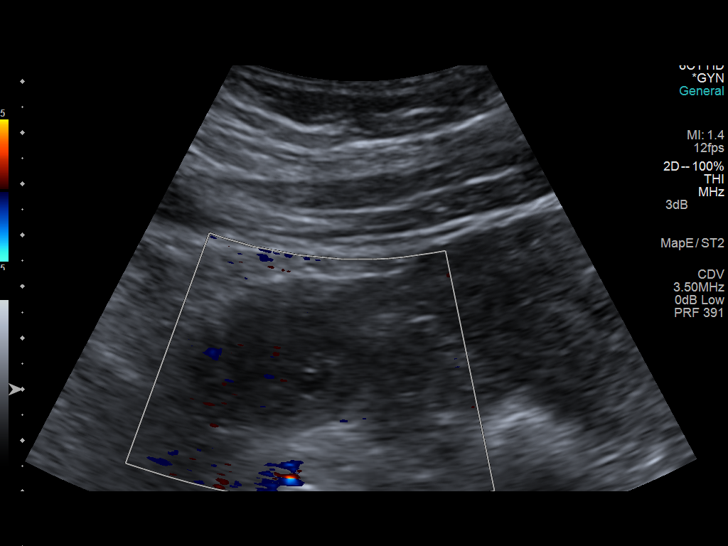

[13 of 25 positions shown; findings below may reference images not displayed]

FINDINGS: Uterus

Measurements: 8.0 x 4.9 x 6.1 cm. No fibroids or other mass
visualized. There is a small amount of fluid in the cervix. There is
a 5 mm cervical nabothian cyst.

Endometrium

Thickness: 9 mm.  No focal abnormality visualized.

Right ovary

Measurements: 3.8 x 2.5 x 2.0 cm. Normal appearance/no adnexal mass.

Left ovary

Measurements: 3.5 x 2.8 x 3.5 cm. Normal appearance/no adnexal mass.

Pulsed Doppler evaluation of both ovaries demonstrates normal
low-resistance arterial and venous waveforms. The peak systolic
velocity in the left ovary is 10.6 centimeters/second with an
end-diastolic velocity of 5.9 centimeter/second. The peak systolic
velocity in the right ovary is 6.9 centimeter/second with an
end-diastolic velocity of 4.6 centimeter/second.

Other findings

No free fluid.
IMPRESSION: Small amount of fluid in the cervix. Small cervical nabothian cyst.
Uterus otherwise appears normal. No ovarian masses or torsion on
either side. No free pelvic fluid.

## 2015-09-18 ENCOUNTER — Encounter (HOSPITAL_COMMUNITY): Payer: Self-pay | Admitting: Emergency Medicine

## 2015-09-18 ENCOUNTER — Ambulatory Visit (HOSPITAL_COMMUNITY)
Admission: EM | Admit: 2015-09-18 | Discharge: 2015-09-18 | Disposition: A | Discharge status: Home / Self Care | Payer: 59 | Attending: Family Medicine | Admitting: Family Medicine

## 2015-09-18 DIAGNOSIS — L0211 Cutaneous abscess of neck: Secondary | ICD-10-CM

## 2015-09-18 MED ORDER — CEPHALEXIN 500 MG PO CAPS: 500.0000 mg | ORAL_CAPSULE | Freq: Three times a day (TID) | ORAL | 0 refills | Status: AC

## 2015-09-18 NOTE — Discharge Instructions (Signed)
Start Keflex 500mg  3 times a day as directed. Apply warm compresses to area 3 times a day. If not improving in 3 days, return for possible Incision and Drainage of abscess.

## 2015-09-18 NOTE — ED Triage Notes (Signed)
Here for a cyst/abscess on back on neck onset x4 days associated w/mild pain (2/10)  Denies fevers, drainage  A&O x4... NAD

## 2015-09-18 NOTE — ED Provider Notes (Signed)
CSN: [id number]     Arrival date & time 09/18/15  1755 History   First MD Initiated Contact with Patient 09/18/15 1848     Chief Complaint  Patient presents with  . Cyst   (Consider location/radiation/quality/duration/timing/severity/associated sxs/prior Treatment) 35 year old female presents with cyst/abscess on right side of back of neck for the past 3 to 4 days. Painful but no drainage. It started as a small bump along her hairline a few months ago but has become more swollen, red and painful in the past few days. She went to another Urgent Care Center last evening right before they closed. They thought she had an infected cyst and supposedly had sent an RX for an antibiotic to the pharmacy for the patient but she is uncertain. She came today to get a second opinion about the cyst.    The history is provided by the patient and the spouse.    Past Medical History:  Diagnosis Date  . Anxiety   . Headache(784.0)   . Hypertension   . Urinary tract infection    Past Surgical History:  Procedure Laterality Date  . BREAST REDUCTION SURGERY  2000  . CESAREAN SECTION    . CESAREAN SECTION  07/03/2011   Procedure: CESAREAN SECTION;  Surgeon: Bing Plumehomas F Henley, MD;  Location: [facility];  Service: Gynecology;  Laterality: N/A;  Repeat Cesarean Section Delivery Boy @ 0102, Apgars 9/9   Family History  Problem Relation Age of Onset  . Diabetes Maternal Aunt   . Diabetes Maternal Uncle   . Cancer Maternal Grandmother   . Anesthesia problems Neg Hx   . Hypotension Neg Hx   . Malignant hyperthermia Neg Hx   . Pseudochol deficiency Neg Hx    Social History  Substance Use Topics  . Smoking status: Never Smoker  . Smokeless tobacco: Never Used  . Alcohol use No   OB History    Gravida Para Term Preterm AB Living   2 2 2     2    SAB TAB Ectopic Multiple Live Births           2     Review of Systems  Constitutional: Negative for chills, fatigue and fever.  Musculoskeletal: Positive for  neck pain.  Skin: Negative for rash.  Neurological: Negative for dizziness, numbness and headaches.    Allergies  Review of patient's allergies indicates no known allergies.  Home Medications   Prior to Admission medications   Medication Sig Start Date End Date Taking? Authorizing Provider  amLODipine (NORVASC) 10 MG tablet Take 10 mg by mouth every evening.   Yes Historical Provider, MD  lisinopril-hydrochlorothiazide (PRINZIDE,ZESTORETIC) 10-12.5 MG per tablet Take 1 tablet by mouth daily. 04/23/14  Yes Historical Provider, MD  metFORMIN (GLUCOPHAGE-XR) 500 MG 24 hr tablet Take 500 mg by mouth daily. 01/09/14  Yes Historical Provider, MD  cephALEXin (KEFLEX) 500 MG capsule Take 1 capsule (500 mg total) by mouth 3 (three) times daily. 09/18/15 09/25/15  Sudie GrumblingAnn Berry Amyot, NP  Ibuprofen (ADVIL) 200 MG CAPS Take 1-2 capsules by mouth every 6 (six) hours as needed (cramping).    Historical Provider, MD  naproxen sodium (ANAPROX DS) 550 MG tablet Take 1 tablet (550 mg total) by mouth 2 (two) times daily with a meal. 01/16/15 01/16/16  Carmelina DaneJeffery S Anderson, MD  traMADol (ULTRAM) 50 MG tablet Take 1 tablet (50 mg total) by mouth every 6 (six) hours as needed. 12/05/14   Armando ReichertHeather D Hogan, CNM   Meds  Ordered and Administered this Visit  Medications - No data to display  BP 98/58 (BP Location: Right Arm)   Pulse 98   Temp 98.6 F (37 C) (Oral)   Resp 20   LMP 08/27/2015   SpO2 97%  No data found.   Physical Exam  Constitutional: She is oriented to person, place, and time. She appears well-developed and well-nourished. No distress.  HENT:  Head: Normocephalic and atraumatic.  Neck: Trachea normal, normal range of motion and full passive range of motion without pain. Neck supple. No neck rigidity.    2cm by 1cm raised swollen slightly red cyst present on right side of back of neck near hairline. Mainly firm with minimal fluid present. Very tender. No surrounding erythema. No drainage. Slightly  warm to touch.    Cardiovascular: Normal rate and regular rhythm.   Neurological: She is alert and oriented to person, place, and time.  Skin: Skin is warm, dry and intact. Capillary refill takes less than 2 seconds.  Psychiatric: She has a normal mood and affect. Her behavior is normal. Judgment and thought content normal.    Urgent Care Course   Clinical Course    Procedures (including critical care time)  Labs Review Labs Reviewed - No data to display  Imaging Review No results found.   Visual Acuity Review  Right Eye Distance:   Left Eye Distance:   Bilateral Distance:    Right Eye Near:   Left Eye Near:    Bilateral Near:         MDM   1. Abscess of neck    Discussed that cyst appears to be an infection of a hair follicle. Since mainly firm and minimal fluid present, discussed limited advantage of Incision and Drainage at this time. Recommend start Keflex 500mg  TID for 7 days- Rx written. May either start Keflex or may take antibiotic that other Urgent Care center had sent to the pharmacy (she believes it may be Bactrim). Apply warm compresses to area for comfort. Take Ibuprofen 600mg  every 6 hours for pain and swelling. Recommend follow-up with her primary care provider in 2 to 3 days if not improving to evaluate for possible I & D at that time.     Sudie GrumblingAnn Berry Amyot, NP 09/19/15 1004

## 2015-09-22 ENCOUNTER — Encounter (HOSPITAL_COMMUNITY): Payer: Self-pay | Admitting: Emergency Medicine

## 2015-09-22 ENCOUNTER — Ambulatory Visit (HOSPITAL_COMMUNITY)
Admission: EM | Admit: 2015-09-22 | Discharge: 2015-09-22 | Disposition: A | Discharge status: Home / Self Care | Payer: 59

## 2015-09-22 DIAGNOSIS — L0291 Cutaneous abscess, unspecified: Secondary | ICD-10-CM

## 2015-09-22 NOTE — Discharge Instructions (Signed)
Finish antibiotic and call surgeon in am for appt for further eval and treatment.

## 2015-09-22 NOTE — ED Triage Notes (Signed)
[facility] a [facility] behind her right ear that bas been present for about 2 weeks. [facility] and [facility] on Keflex. The patient stated that it has not gotten any better and still hurts.

## 2015-09-22 NOTE — ED Provider Notes (Signed)
[facility]    CSN: [id number] Arrival date & time: 09/22/15  1739  First Provider Contact:  First MD Initiated Contact with Patient 09/22/15 1844        History   Chief Complaint Chief Complaint  Patient presents with  . Abscess    HPI Jamie Hays is a 35 y.o. female.   The history is provided by the patient.  Abscess  Location:  Head/neck Head/neck abscess location:  R neck Abscess quality: induration and painful   Red streaking: no   Duration:  1 week Progression:  Worsening Pain details:    Quality:  Sharp   Severity:  Moderate   Duration:  1 month   Progression:  Worsening Chronicity:  New Context comment:  Seen 8/17 and given keflex with worsening of lesion. Ineffective treatments:  Oral antibiotics Associated symptoms: no fever     Past Medical History:  Diagnosis Date  . Anxiety   . Headache(784.0)   . Hypertension   . Urinary tract infection     Patient Active Problem List   Diagnosis Date Noted  . Prediabetes 12/22/2014  . Hypertension 06/29/2012    Past Surgical History:  Procedure Laterality Date  . BREAST REDUCTION SURGERY  2000  . CESAREAN SECTION    . CESAREAN SECTION  07/03/2011   Procedure: CESAREAN SECTION;  Surgeon: Bing Plumehomas F Henley, MD;  Location: [facility];  Service: Gynecology;  Laterality: N/A;  Repeat Cesarean Section Delivery Boy @ 0102, Apgars 9/9    OB History    Gravida Para Term Preterm AB Living   2 2 2     2    SAB TAB Ectopic Multiple Live Births           2       Home Medications    Prior to Admission medications   Medication Sig Start Date End Date Taking? Authorizing Provider  amLODipine (NORVASC) 10 MG tablet Take 10 mg by mouth every evening.   Yes Historical Provider, MD  cephALEXin (KEFLEX) 500 MG capsule Take 1 capsule (500 mg total) by mouth 3 (three) times daily. 09/18/15 09/25/15 Yes Sudie GrumblingAnn Berry Amyot, NP  lisinopril-hydrochlorothiazide (PRINZIDE,ZESTORETIC) 10-12.5 MG per tablet Take 1 tablet by  mouth daily. 04/23/14  Yes Historical Provider, MD  metFORMIN (GLUCOPHAGE-XR) 500 MG 24 hr tablet Take 500 mg by mouth daily. 01/09/14  Yes Historical Provider, MD  Ibuprofen (ADVIL) 200 MG CAPS Take 1-2 capsules by mouth every 6 (six) hours as needed (cramping).    Historical Provider, MD  naproxen sodium (ANAPROX DS) 550 MG tablet Take 1 tablet (550 mg total) by mouth 2 (two) times daily with a meal. 01/16/15 01/16/16  Carmelina DaneJeffery S Anderson, MD  traMADol (ULTRAM) 50 MG tablet Take 1 tablet (50 mg total) by mouth every 6 (six) hours as needed. 12/05/14   Armando ReichertHeather D Hogan, CNM    Family History Family History  Problem Relation Age of Onset  . Diabetes Maternal Aunt   . Diabetes Maternal Uncle   . Cancer Maternal Grandmother   . Anesthesia problems Neg Hx   . Hypotension Neg Hx   . Malignant hyperthermia Neg Hx   . Pseudochol deficiency Neg Hx     Social History Social History  Substance Use Topics  . Smoking status: Never Smoker  . Smokeless tobacco: Never Used  . Alcohol use No     Allergies   Review of patient's allergies indicates no known allergies.   Review of Systems Review of Systems  Constitutional: Negative.  Negative for fever.  Musculoskeletal: Positive for neck pain.  Skin: Positive for rash.  Hematological: Positive for adenopathy.  All other systems reviewed and are negative.    Physical Exam Triage Vital Signs ED Triage Vitals  Enc Vitals Group     BP 09/22/15 1757 112/66     Pulse Rate 09/22/15 1757 87     Resp 09/22/15 1757 20     Temp 09/22/15 1757 98.9 F (37.2 C)     Temp Source 09/22/15 1757 Oral     SpO2 09/22/15 1757 98 %     Weight --      Height --      Head Circumference --      Peak Flow --      Pain Score 09/22/15 1803 6     Pain Loc --      Pain Edu? --      Excl. in GC? --    No data found.   Updated Vital Signs BP 112/66 (BP Location: Left Arm)   Pulse 87   Temp 98.9 F (37.2 C) (Oral)   Resp 20   LMP 08/27/2015   SpO2  98%   Visual Acuity Right Eye Distance:   Left Eye Distance:   Bilateral Distance:    Right Eye Near:   Left Eye Near:    Bilateral Near:     Physical Exam  Constitutional: She is oriented to person, place, and time. She appears well-developed and well-nourished. No distress.  Neck:  Tender 1.5x1.5 firm tender right pc lesion.  Lymphadenopathy:    She has cervical adenopathy.  Neurological: She is alert and oriented to person, place, and time.  Skin: Skin is warm and dry.  Nursing note and vitals reviewed.    UC Treatments / Results  Labs (all labs ordered are listed, but only abnormal results are displayed) Labs Reviewed - No data to display  EKG  EKG Interpretation None       Radiology No results found.  Procedures Procedures (including critical care time)  Medications Ordered in UC Medications - No data to display   Initial Impression / Assessment and Plan / UC Course  I have reviewed the triage vital signs and the nursing notes.  Pertinent labs & imaging results that were available during my care of the patient were reviewed by me and considered in my medical decision making (see chart for details).  Clinical Course      Final Clinical Impressions(s) / UC Diagnoses   Final diagnoses:  None    New Prescriptions New Prescriptions   No medications on file     Linna HoffJames D Bryttney Netzer, MD 09/22/15 1902

## 2016-07-19 ENCOUNTER — Emergency Department (HOSPITAL_COMMUNITY)
Admission: EM | Admit: 2016-07-19 | Discharge: 2016-07-20 | Discharge status: Against Medical Advice | Payer: Medicaid Other

## 2016-07-19 NOTE — ED Notes (Signed)
No answer when called for triage 

## 2016-10-28 ENCOUNTER — Encounter (HOSPITAL_COMMUNITY): Payer: Self-pay | Admitting: Emergency Medicine

## 2016-10-28 ENCOUNTER — Emergency Department (HOSPITAL_COMMUNITY): Payer: Medicaid Other

## 2016-10-28 ENCOUNTER — Emergency Department (HOSPITAL_COMMUNITY)
Admission: EM | Admit: 2016-10-28 | Discharge: 2016-10-28 | Disposition: A | Discharge status: Home / Self Care | Payer: Medicaid Other | Attending: Emergency Medicine | Admitting: Emergency Medicine

## 2016-10-28 DIAGNOSIS — Z5321 Procedure and treatment not carried out due to patient leaving prior to being seen by health care provider: Secondary | ICD-10-CM | POA: Diagnosis not present

## 2016-10-28 DIAGNOSIS — R079 Chest pain, unspecified: Secondary | ICD-10-CM | POA: Insufficient documentation

## 2016-10-28 LAB — POCT I-STAT TROPONIN I: Troponin i, poc: 0 ng/mL (ref 0.00–0.08)

## 2016-10-28 LAB — CBC
HEMATOCRIT: 42.3 % (ref 36.0–46.0)
Hemoglobin: 14.4 g/dL (ref 12.0–15.0)
MCH: 29.9 pg (ref 26.0–34.0)
MCHC: 34 g/dL (ref 30.0–36.0)
MCV: 87.8 fL (ref 78.0–100.0)
PLATELETS: 282 10*3/uL (ref 150–400)
RBC: 4.82 MIL/uL (ref 3.87–5.11)
RDW: 14 % (ref 11.5–15.5)
WBC: 11.3 10*3/uL — ABNORMAL HIGH (ref 4.0–10.5)

## 2016-10-28 LAB — BASIC METABOLIC PANEL
Anion gap: 6 (ref 5–15)
BUN: 10 mg/dL (ref 6–20)
CHLORIDE: 103 mmol/L (ref 101–111)
CO2: 27 mmol/L (ref 22–32)
CREATININE: 0.61 mg/dL (ref 0.44–1.00)
Calcium: 9 mg/dL (ref 8.9–10.3)
GFR calc Af Amer: >60 mL/min (ref >60)
GFR calc non Af Amer: >60 mL/min (ref >60)
GLUCOSE: 138 mg/dL — AB (ref 65–99)
Potassium: 4.1 mmol/L (ref 3.5–5.1)
SODIUM: 136 mmol/L (ref 135–145)

## 2016-10-28 NOTE — ED Triage Notes (Signed)
Pt tats she is having pain in her left chest that started about 2330 on Wednesday night  Pt states she has numbness and tingling in her left arm and hand  Pt states the pain in her chest started off as a tingling then it became hot and tight  Pt is not in any acute distress at this time

## 2016-10-28 NOTE — ED Provider Notes (Signed)
ECG Interpreatation: ECG shows normal sinus rhythm with a rate of 93, no ectopy. Normal axis. Normal P wave. Normal QRS. Normal intervals. Normal ST and T waves. Impression: normal ECG. When compared with ECG on 05/31/2014, no significant changes are seen.   Dione Booze, MD 10/28/16 [phone]

## 2018-01-19 IMAGING — CR DG CHEST 2V
2 series · 2 of 2 positions shown · non-contrast
Comparison: 02/13/2014 chest radiograph

CLINICAL DATA: 35 y/o  F; chest pain.

EXAM:
CHEST  2 VIEW

[w chest pa]
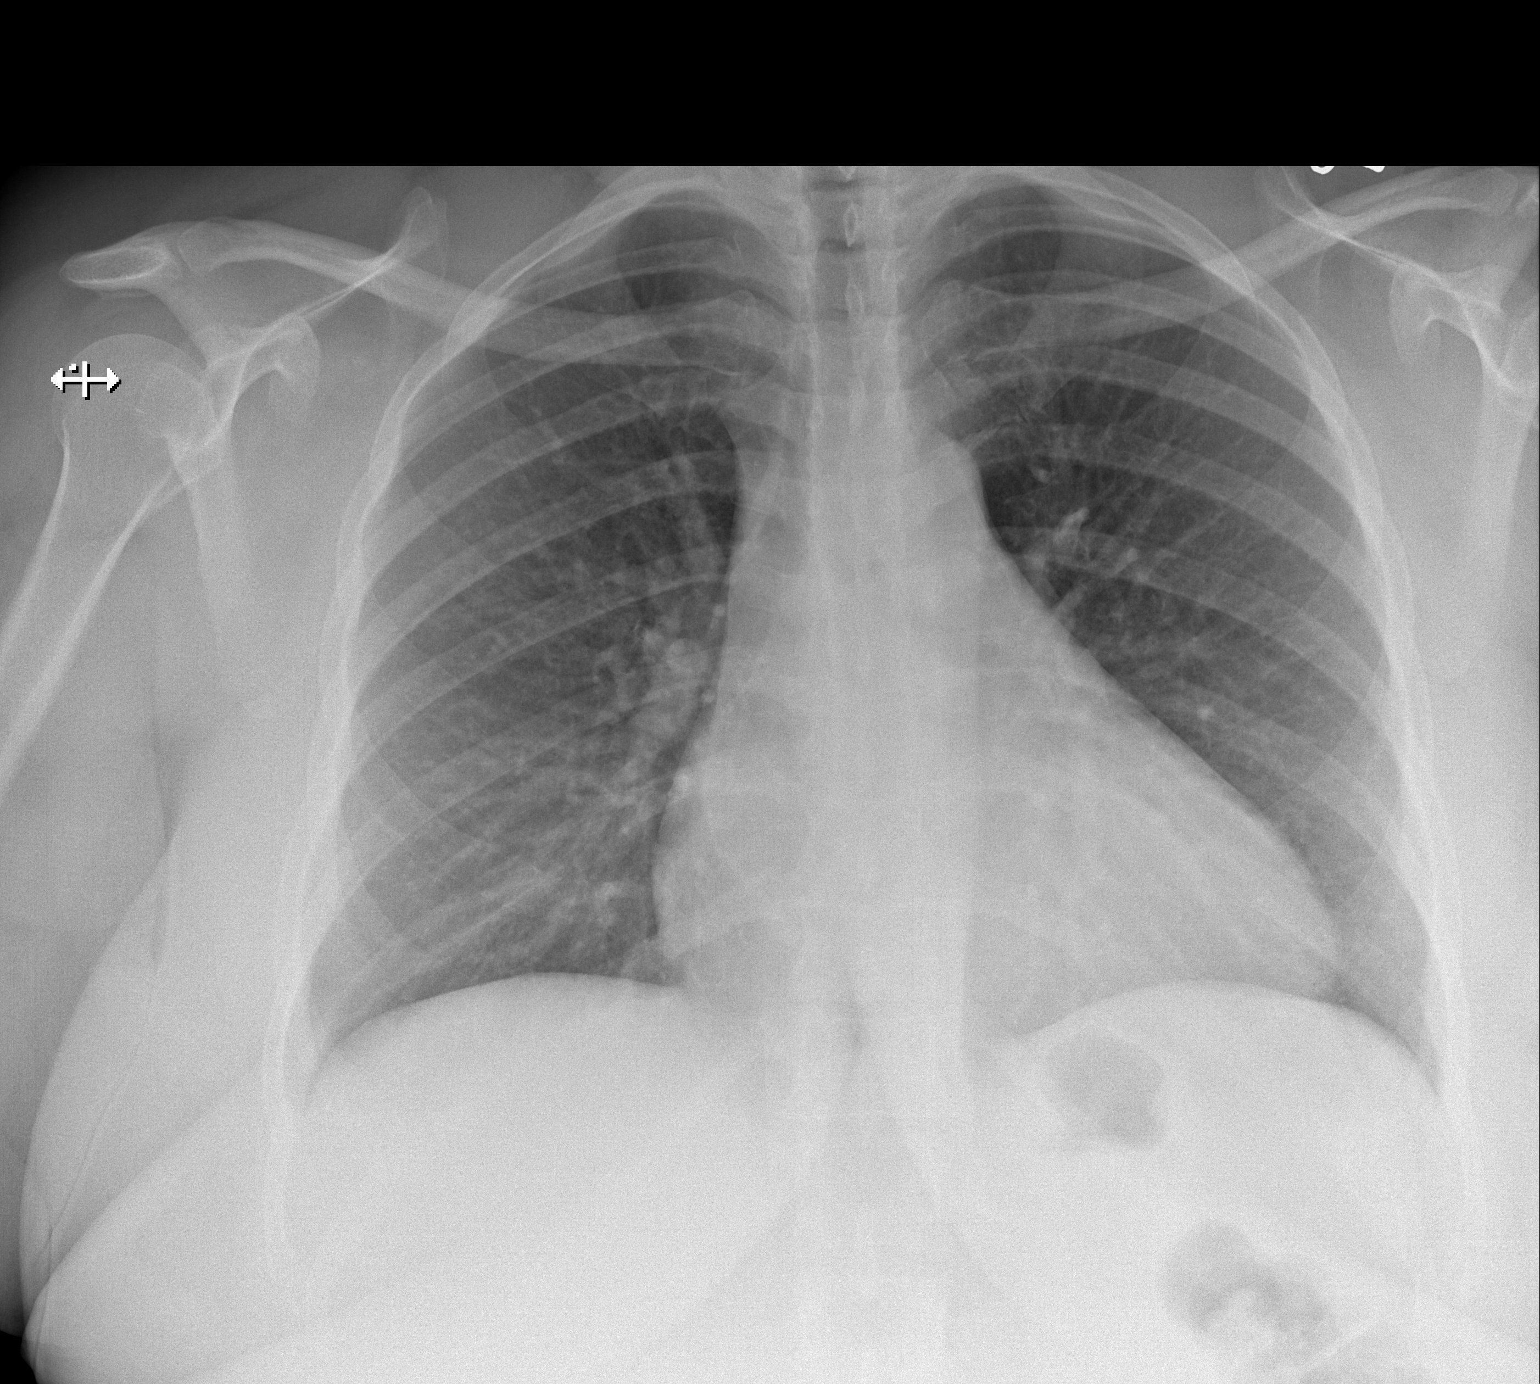

[w chest lat]
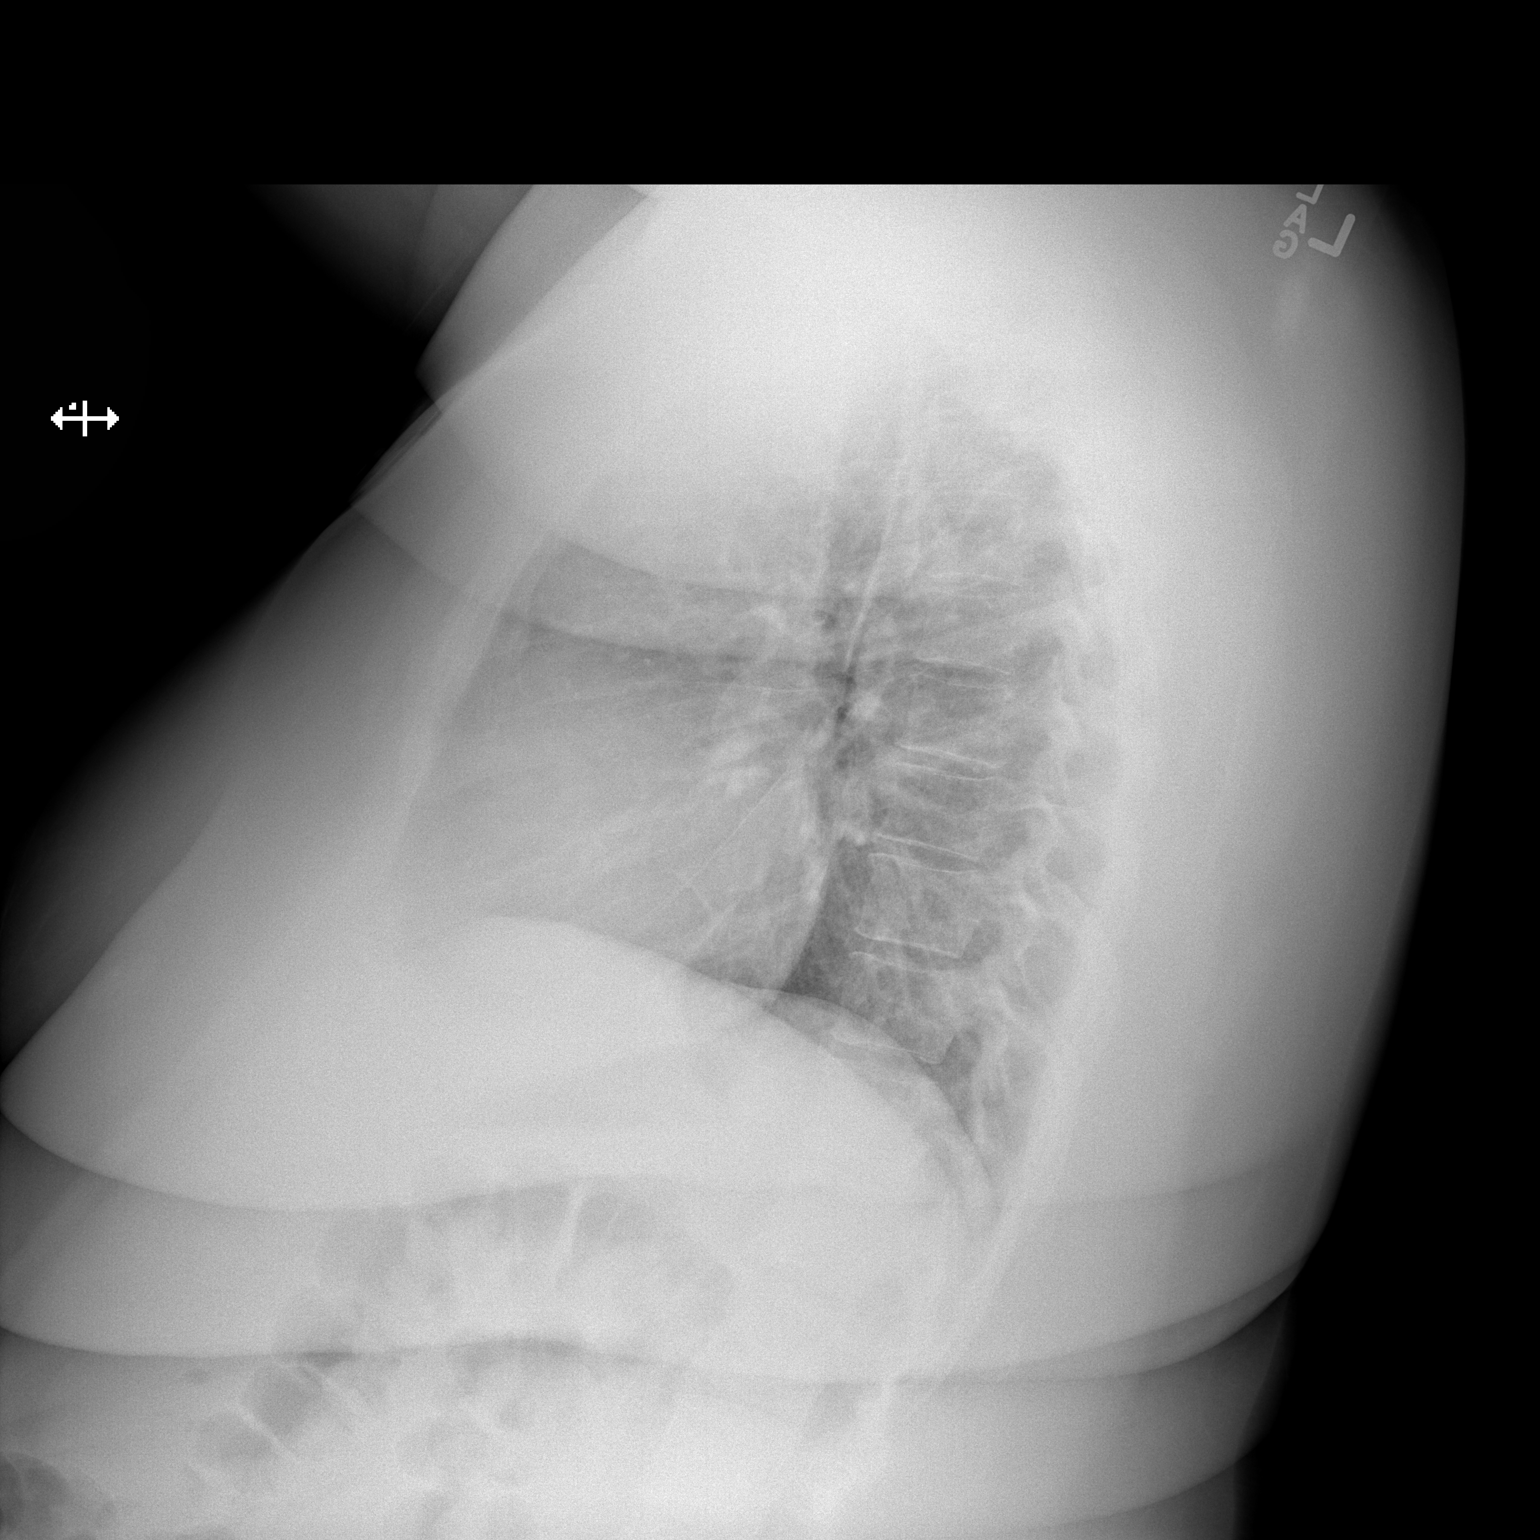

[2 of 2 positions shown; findings below may reference images not displayed]

FINDINGS: Stable cardiomegaly. Clear lungs. No pleural effusion or
pneumothorax. Bones are unremarkable.
IMPRESSION: Stable cardiomegaly.  Clear lungs.

By: Olimpia Tiger M.D.

## 2019-01-29 ENCOUNTER — Encounter: Payer: Self-pay | Admitting: Emergency Medicine

## 2019-01-29 ENCOUNTER — Other Ambulatory Visit: Payer: Self-pay

## 2019-01-29 ENCOUNTER — Ambulatory Visit
Admission: EM | Admit: 2019-01-29 | Discharge: 2019-01-29 | Disposition: A | Discharge status: Home / Self Care | Payer: Medicaid Other

## 2019-01-29 DIAGNOSIS — R05 Cough: Secondary | ICD-10-CM

## 2019-01-29 DIAGNOSIS — J029 Acute pharyngitis, unspecified: Secondary | ICD-10-CM

## 2019-01-29 DIAGNOSIS — R059 Cough, unspecified: Secondary | ICD-10-CM

## 2019-01-29 DIAGNOSIS — Z20828 Contact with and (suspected) exposure to other viral communicable diseases: Secondary | ICD-10-CM | POA: Diagnosis not present

## 2019-01-29 NOTE — Discharge Instructions (Signed)
For congestion: take a daily anti-histamine like Zyrtec, Claritin, and a oral decongestant to help with post nasal drip that may be irritating your throat.  Please take Tylenol or Ibuprofen for fever and pain. May try salt water gargles, cepacol lozenges, throat spray, or OTC cold relief medicine for throat discomfort. If you also have congestion take a daily anti-histamine like Zyrtec, Claritin, and a oral decongestant to help with post nasal drip that may be irritating your throat.  Stay hydrated and drink plenty of fluids to keep your throat coated relieve irritation.

## 2019-01-29 NOTE — ED Triage Notes (Signed)
Pt presents to [facility] for assessment sore throat x 2-3 days with cough.  Denies fever.  Denies n/v/d.  Daughter had strep 2 weeks ago.

## 2019-01-29 NOTE — ED Provider Notes (Signed)
[name]    CSN: [id number] Arrival date & time: 01/29/19  1839      History   Chief Complaint Chief Complaint  Patient presents with  . Sore Throat    HPI Jamie Hays is a 38 y.o. female with history of hypertension, headaches presenting for 2 to 3-day course of sore throat with cough.  States cough is dry, nonproductive, without hemoptysis.  No fever, nausea, vomiting, diarrhea.  No known Covid contacts.  States her daughter had strep 2 weeks ago.  States sore throat is scratchy, worse with postnasal drip.  Has tried drinking tea for symptoms with some relief.  Past Medical History:  Diagnosis Date  . Anxiety   . Headache(784.0)   . Hypertension   . Urinary tract infection     Patient Active Problem List   Diagnosis Date Noted  . Prediabetes 12/22/2014  . Hypertension 06/29/2012    Past Surgical History:  Procedure Laterality Date  . BREAST REDUCTION SURGERY  2000  . CESAREAN SECTION    . CESAREAN SECTION  07/03/2011   Procedure: CESAREAN SECTION;  Surgeon: Melina Schools, MD;  Location: [facility];  Service: Gynecology;  Laterality: N/A;  Repeat Cesarean Section Delivery Boy @ 0102, Apgars 9/9    OB History    Gravida  2   Para  2   Term  2   Preterm      AB      Living  2     SAB      TAB      Ectopic      Multiple      Live Births  2            Home Medications    Prior to Admission medications   Medication Sig Start Date End Date Taking? Authorizing Provider  hydrochlorothiazide (HYDRODIURIL) 25 MG tablet Take 25 mg by mouth daily.   Yes [provider]  amLODipine (NORVASC) 10 MG tablet Take 10 mg by mouth every evening.    [provider]  Ibuprofen (ADVIL) 200 MG CAPS Take 1-2 capsules by mouth every 6 (six) hours as needed (cramping).    [provider]  lisinopril-hydrochlorothiazide (PRINZIDE,ZESTORETIC) 10-12.5 MG per tablet Take 1 tablet by mouth daily. 04/23/14   [provider]   metFORMIN (GLUCOPHAGE-XR) 500 MG 24 hr tablet Take 500 mg by mouth daily. 01/09/14   [provider]    Family History Family History  Problem Relation Age of Onset  . Diabetes Maternal Aunt   . Diabetes Maternal Uncle   . Cancer Maternal Grandmother   . Anesthesia problems Neg Hx   . Hypotension Neg Hx   . Malignant hyperthermia Neg Hx   . Pseudochol deficiency Neg Hx     Social History Social History   Tobacco Use  . Smoking status: Never Smoker  . Smokeless tobacco: Never Used  Substance Use Topics  . Alcohol use: No  . Drug use: No     Allergies   Patient has no known allergies.   Review of Systems Review of Systems  Constitutional: Negative for fatigue and fever.  HENT: Positive for postnasal drip and sore throat. Negative for congestion, dental problem, ear pain, facial swelling, hearing loss, sinus pain, trouble swallowing and voice change.   Eyes: Negative for photophobia, pain and visual disturbance.  Respiratory: Positive for cough. Negative for choking, chest tightness, shortness of breath, wheezing and stridor.   Cardiovascular: Negative for chest pain  and palpitations.  Gastrointestinal: Negative for diarrhea and vomiting.  Musculoskeletal: Negative for arthralgias and myalgias.  Neurological: Negative for dizziness and headaches.     Physical Exam Triage Vital Signs ED Triage Vitals  Enc Vitals Group     BP      Pulse      Resp      Temp      Temp src      SpO2      Weight      Height      Head Circumference      Peak Flow      Pain Score      Pain Loc      Pain Edu?      Excl. in GC?    No data found.  Updated Vital Signs BP (!) 140/98 (BP Location: Right Arm)   Pulse 89   Temp 98 F (36.7 C) (Temporal)   Resp 20   LMP 01/24/2019   SpO2 98%   Visual Acuity Right Eye Distance:   Left Eye Distance:   Bilateral Distance:    Right Eye Near:   Left Eye Near:    Bilateral Near:     Physical Exam Constitutional:       General: She is not in acute distress.    Appearance: She is well-developed. She is obese. She is not ill-appearing.  HENT:     Head: Normocephalic and atraumatic.     Mouth/Throat:     Mouth: Mucous membranes are moist.     Pharynx: Oropharynx is clear. Uvula midline. No pharyngeal swelling, oropharyngeal exudate, posterior oropharyngeal erythema or uvula swelling.     Tonsils: 2+ on the right. 2+ on the left.  Eyes:     General: No scleral icterus.    Conjunctiva/sclera: Conjunctivae normal.     Pupils: Pupils are equal, round, and reactive to light.  Cardiovascular:     Rate and Rhythm: Normal rate.  Pulmonary:     Effort: Pulmonary effort is normal. No respiratory distress.     Breath sounds: No rhonchi or rales.  Lymphadenopathy:     Cervical: No cervical adenopathy.  Skin:    Capillary Refill: Capillary refill takes less than 2 seconds.     Coloration: Skin is not jaundiced or pale.     Findings: No rash.  Neurological:     Mental Status: She is alert and oriented to person, place, and time.      UC Treatments / Results  Labs (all labs ordered are listed, but only abnormal results are displayed) Labs Reviewed  NOVEL CORONAVIRUS, NAA    EKG   Radiology No results found.  Procedures Procedures (including critical care time)  Medications Ordered in UC Medications - No data to display  Initial Impression / Assessment and Plan / UC Course  I have reviewed the triage vital signs and the nursing notes.  Pertinent labs & imaging results that were available during my care of the patient were reviewed by me and considered in my medical decision making (see chart for details).     Patient afebrile, nontoxic, with SpO2 98%.  Low concern for streptococcal infection given patient has cough, no fever, and exam is reassuring: Strep testing deferred at this time.  Cough likely second to postnasal drip/season change.  Covid PCR pending.  Patient to quarantine until  results are back.  We will continue supportive management.  Return precautions discussed, patient verbalized understanding and is agreeable to plan. Final Clinical  Impressions(s) / UC Diagnoses   Final diagnoses:  Sore throat  Cough     Discharge Instructions     For congestion: take a daily anti-histamine like Zyrtec, Claritin, and a oral decongestant to help with post nasal drip that may be irritating your throat.  Please take Tylenol or Ibuprofen for fever and pain. May try salt water gargles, cepacol lozenges, throat spray, or OTC cold relief medicine for throat discomfort. If you also have congestion take a daily anti-histamine like Zyrtec, Claritin, and a oral decongestant to help with post nasal drip that may be irritating your throat.  Stay hydrated and drink plenty of fluids to keep your throat coated relieve irritation.     ED Prescriptions    None     PDMP not reviewed this encounter.   Odette FractionHall-Potvin, [place] Valley[place]itt[place], [place]PA-C 01/30/19 [phone]

## 2019-01-29 NOTE — ED Notes (Signed)
Patient able to ambulate independently  

## 2019-01-30 ENCOUNTER — Telehealth: Payer: Self-pay | Admitting: Emergency Medicine

## 2019-01-30 NOTE — Telephone Encounter (Signed)
Pt called needing an extension to her work note due to COVID test result not being back yet.  Will update and patient to print from [place]

## 2019-01-31 LAB — NOVEL CORONAVIRUS, NAA: SARS-CoV-2, NAA: NOT DETECTED

## 2019-11-07 ENCOUNTER — Other Ambulatory Visit: Payer: 59

## 2019-11-07 DIAGNOSIS — Z20822 Contact with and (suspected) exposure to covid-19: Secondary | ICD-10-CM

## 2019-11-08 LAB — SARS-COV-2, NAA 2 DAY TAT

## 2019-11-08 LAB — NOVEL CORONAVIRUS, NAA: SARS-CoV-2, NAA: DETECTED — AB

## 2019-11-09 ENCOUNTER — Telehealth: Payer: Self-pay | Admitting: Family

## 2019-11-09 ENCOUNTER — Other Ambulatory Visit: Payer: Self-pay | Admitting: Family

## 2019-11-09 DIAGNOSIS — U071 COVID-19: Secondary | ICD-10-CM

## 2019-11-09 DIAGNOSIS — I10 Essential (primary) hypertension: Secondary | ICD-10-CM

## 2019-11-09 DIAGNOSIS — Z609 Problem related to social environment, unspecified: Secondary | ICD-10-CM

## 2019-11-09 NOTE — Progress Notes (Signed)
I connected by phone with Jamie Hays on 11/09/2019 at 12:05 PM to discuss the potential use of a new treatment for mild to moderate COVID-19 viral infection in non-hospitalized patients.  This patient is a 40 y.o. female that meets the FDA criteria for Emergency Use Authorization of COVID monoclonal antibody casirivimab/imdevimab or bamlanivimab/eteseviamb.  Has a (+) direct SARS-CoV-2 viral test result  Has mild or moderate COVID-19   Is NOT hospitalized due to COVID-19  Is within 10 days of symptom onset  Has at least one of the high risk factor(s) for progression to severe COVID-19 and/or hospitalization as defined in EUA.  Specific high risk criteria : Cardiovascular disease or hypertension and Other high risk medical condition per CDC:  high risk social score.    I have spoken and communicated the following to the patient or parent/caregiver regarding COVID monoclonal antibody treatment:  1. FDA has authorized the emergency use for the treatment of mild to moderate COVID-19 in adults and pediatric patients with positive results of direct SARS-CoV-2 viral testing who are 59 years of age and older weighing at least 40 kg, and who are at high risk for progressing to severe COVID-19 and/or hospitalization.  2. The significant known and potential risks and benefits of COVID monoclonal antibody, and the extent to which such potential risks and benefits are unknown.  3. Information on available alternative treatments and the risks and benefits of those alternatives, including clinical trials.  4. Patients treated with COVID monoclonal antibody should continue to self-isolate and use infection control measures (e.g., wear mask, isolate, social distance, avoid sharing personal items, clean and disinfect "high touch" surfaces, and frequent handwashing) according to CDC guidelines.   5. The patient or parent/caregiver has the option to accept or refuse COVID monoclonal antibody  treatment.  After reviewing this information with the patient, the patient has agreed to receive one of the available covid 19 monoclonal antibodies and will be provided an appropriate fact sheet prior to infusion.   [name], FNP 11/09/2019 12:05 PM

## 2019-11-09 NOTE — Telephone Encounter (Signed)
Called to Discuss with patient about Covid symptoms and the use of the monoclonal antibody infusion for those with mild to moderate Covid symptoms and at a high risk of hospitalization.     Pt appears to qualify for this infusion due to co-morbid conditions and/or a member of an at-risk group in accordance with the FDA Emergency Use Authorization.    Jamie Hays tested positive for COVID 19 on 11/07/19. Qualifying risk factors include hypertension, BMI >25, and high social risk vulnerability. Currently experiencing nausea and dry heaving. Symptoms started 10/5.   Discussed the risks/benefits of treatment with Regeneron and Jamie Hays wishes to continue with treatment.   [name],   You have been scheduled to receive Regeneron (the monoclonal antibody we discussed) on : 11/10/19 at 9:30 am   If you have been tested outside of a [facility] - you MUST bring a copy of your positive test with you the morning of your appointment. You may take a photo of this and upload to your MyChart portal or have the testing facility fax the result to [phone]    The address for the infusion clinic site is:  --GPS address is [address] - the parking is located near [employer] building where you will see  COVID19 Infusion feather banner marking the entrance to parking.   (see photos below)            --Enter into the 2nd entrance where the "wave, flag banner" is at the road. Turn into this 2nd entrance and immediately turn left to park in 1 of the 5 parking spots.   --Please stay in your car and call the desk for assistance inside [phone].   --Average time in department is roughly 2 hours for Regeneron treatment - this includes preparation of the medication, IV start and the required 1 hour monitoring after the infusion.    Should you develop worsening shortness of breath, chest pain or severe breathing problems please do not wait for this appointment and go to the  Emergency room for evaluation and treatment. You will undergo another oxygen screen before your infusion to ensure this is the best treatment option for you. There is a chance that the best decision may be to send you to the Emergency Room for evaluation at the time of your appointment.   The day of your visit you should: Marland Kitchen Get plenty of rest the night before and drink plenty of water . Eat a light meal/snack before coming and take your medications as prescribed  . Wear warm, comfortable clothes with a shirt that can roll-up over the elbow (will need IV start).  . Wear a mask  . Consider bringing some activity to help pass the time  Many commercial insurers are waiving bills related to COVID treatment however some have ranged from $300-640. We are starting to see some insurers send bills to patients later for the administration of the medication - we are learning more information but you may receive a bill after your appointment.  Please contact your insurance agent to discuss prior to your appointment if you would like further details about billing specific to your policy.    The CPT code is [id number] for your reference.    I hope this helps find you feeling better,  Jamie Hays

## 2019-11-10 ENCOUNTER — Other Ambulatory Visit (HOSPITAL_COMMUNITY): Payer: Self-pay | Admitting: Adult Health

## 2019-11-10 ENCOUNTER — Ambulatory Visit (HOSPITAL_COMMUNITY)
Admission: RE | Admit: 2019-11-10 | Discharge: 2019-11-10 | Disposition: A | Discharge status: Home / Self Care | Payer: Medicaid Other | Source: Ambulatory Visit | Attending: Pulmonary Disease | Admitting: Pulmonary Disease

## 2019-11-10 DIAGNOSIS — Z609 Problem related to social environment, unspecified: Secondary | ICD-10-CM | POA: Diagnosis present

## 2019-11-10 DIAGNOSIS — I10 Essential (primary) hypertension: Secondary | ICD-10-CM | POA: Diagnosis present

## 2019-11-10 DIAGNOSIS — U071 COVID-19: Secondary | ICD-10-CM | POA: Diagnosis not present

## 2019-11-10 MED ORDER — ONDANSETRON HCL 4 MG/2ML IJ SOLN: 4.0000 mg | Freq: Once | INTRAMUSCULAR | Status: AC

## 2019-11-10 MED ORDER — DIPHENHYDRAMINE HCL 50 MG/ML IJ SOLN: 50.0000 mg | Freq: Once | INTRAMUSCULAR | Status: DC | PRN

## 2019-11-10 MED ORDER — ONDANSETRON HCL 4 MG PO TABS: 4.0000 mg | ORAL_TABLET | Freq: Three times a day (TID) | ORAL | 0 refills | Status: AC | PRN

## 2019-11-10 MED ORDER — ONDANSETRON HCL 4 MG/2ML IJ SOLN
INTRAMUSCULAR | Status: AC
  Administered 2019-11-10: 4 mg
  Filled 2019-11-10: qty 2

## 2019-11-10 MED ORDER — ALBUTEROL SULFATE HFA 108 (90 BASE) MCG/ACT IN AERS: 2.0000 | INHALATION_SPRAY | Freq: Once | RESPIRATORY_TRACT | Status: DC | PRN

## 2019-11-10 MED ORDER — SODIUM CHLORIDE 0.9 % IV SOLN: INTRAVENOUS | Status: DC | PRN

## 2019-11-10 MED ORDER — SODIUM CHLORIDE 0.9 % IV SOLN: Freq: Once | INTRAVENOUS | Status: AC

## 2019-11-10 MED ORDER — FAMOTIDINE IN NACL 20-0.9 MG/50ML-% IV SOLN: 20.0000 mg | Freq: Once | INTRAVENOUS | Status: DC | PRN

## 2019-11-10 MED ORDER — METHYLPREDNISOLONE SODIUM SUCC 125 MG IJ SOLR: 125.0000 mg | Freq: Once | INTRAMUSCULAR | Status: DC | PRN

## 2019-11-10 MED ORDER — ACETAMINOPHEN 325 MG PO TABS
650.0000 mg | ORAL_TABLET | Freq: Once | ORAL | Status: AC
  Administered 2019-11-10: 650 mg via ORAL
  Filled 2019-11-10: qty 2

## 2019-11-10 MED ORDER — EPINEPHRINE 0.3 MG/0.3ML IJ SOAJ: 0.3000 mg | Freq: Once | INTRAMUSCULAR | Status: DC | PRN

## 2019-11-10 NOTE — Progress Notes (Signed)
S. Patient is a 39 year old woman with HTN, and bmi over 25 here today for monoclonal antibody infusion for covid19.  During her monitoring period she developed chest discomfort that was at the center of her chest and described as a 4/10 ache.  She denies any associated symptoms such as shortness of breath, nausea, diaphoresis.  She was febrile at that point and was given 2 tylenol by the RN and her temp decreased.  She notes that she went up and into the bathroom and is now feeling better.  She says she thinks it was the way she was sitting in the chair and completely resolved.  She notes that she is nauseated with her infection and wonders if we can prescribe her Zofran to her local pharmacy.    O. BP 134/80 (BP Location: Left Arm)   Pulse (!) 109   Temp (!) 100.6 F (38.1 C) (Oral)   Resp 17   SpO2 97%  General, patient sitting in chair, no apparent distress Lungs: clear to auscultation, good air movement Cardiac: Regular rate,slight tachycardia Skin, warm and well perfused, no obvious rash or lesions Psych, normal mood and behavior  A. COVID 19 infection  (a) chest discomfort--resolved, likely musculoskeletal  (b) nausea  (c) mild tachycardia  P. I reviewed with Genessis that I agree her chest discomfort was indeed likely positional as it resolved once she got up and started moving around.  She is nauseated, and I sent in a script for zofran to her pharmacy.  Her lung exam is normal which is reassuring.  Her mild tachycardia Is a normal physiological response to her fever. I recommended once she leaves today to continue to drink plenty of fluids.  She verbalized agreement.  Recommended she f/u with her PCP for further concerns.   Lillard Anes, NP

## 2019-11-10 NOTE — Progress Notes (Signed)
  Diagnosis: COVID-19  Physician: Dr. Wright  Procedure: Covid Infusion Clinic Med: casirivimab\imdevimab infusion - Provided patient with casirivimab\imdevimab fact sheet for patients, parents and caregivers prior to infusion.  Complications: No immediate complications noted.  Discharge: Discharged home   Jamie Hays J Dantonio Justen 11/10/2019  

## 2019-11-10 NOTE — Discharge Instructions (Signed)

## 2022-11-08 NOTE — Therapy (Deleted)
OUTPATIENT PHYSICAL THERAPY THORACOLUMBAR EVALUATION   Patient Name: Jamie Hays MRN: [id number] DOB:1980/09/20, 42 y.o., female Today's Date: 11/08/2022  END OF SESSION:   Past Medical History:  Diagnosis Date   Anxiety    Headache(784.0)    Hypertension    Urinary tract infection    Past Surgical History:  Procedure Laterality Date   BREAST REDUCTION SURGERY  2000   CESAREAN SECTION     CESAREAN SECTION  07/03/2011   Procedure: CESAREAN SECTION;  Surgeon: Bing Plume, MD;  Location: [facility];  Service: Gynecology;  Laterality: N/A;  Repeat Cesarean Section Delivery Boy @ 0102, Apgars 9/9   Patient Active Problem List   Diagnosis Date Noted   Prediabetes 12/22/2014   Hypertension 06/29/2012    PCP: Renaye Rakers, MD   REFERRING PROVIDER: Tracey Harries, MD  REFERRING DIAG: Lumbar radicular pain  Rationale for Evaluation and Treatment: Rehabilitation  THERAPY DIAG:  No diagnosis found.  ONSET DATE: ***  SUBJECTIVE:                                                                                                                                                                                           SUBJECTIVE STATEMENT: ***  PERTINENT HISTORY:    PAIN:  Are you having pain? {OPRCPAIN:27236}  PRECAUTIONS: None  RED FLAGS: None   WEIGHT BEARING RESTRICTIONS: No  FALLS:  Has patient fallen in last 6 months? No  OCCUPATION: ***  PLOF: Independent  PATIENT GOALS: To reduce my symptoms  NEXT MD VISIT: ***  OBJECTIVE:  Note: Objective measures were completed at Evaluation unless otherwise noted.  DIAGNOSTIC FINDINGS:  None noted  PATIENT SURVEYS:  FOTO ***  MUSCLE LENGTH: Hamstrings: Right *** deg; Left *** deg Maisie Fus test: Right *** deg; Left *** deg  POSTURE: {posture:25561}  PALPATION: ***  LUMBAR ROM:   AROM eval  Flexion   Extension   Right lateral flexion   Left lateral flexion   Right rotation   Left rotation    (Blank  rows = not tested)  LOWER EXTREMITY ROM:     {AROM/PROM:27142}  Right eval Left eval  Hip flexion    Hip extension    Hip abduction    Hip adduction    Hip internal rotation    Hip external rotation    Knee flexion    Knee extension    Ankle dorsiflexion    Ankle plantarflexion    Ankle inversion    Ankle eversion     (Blank rows = not tested)  LOWER EXTREMITY MMT:    MMT Right eval Left eval  Hip flexion  Hip extension    Hip abduction    Hip adduction    Hip internal rotation    Hip external rotation    Knee flexion    Knee extension    Ankle dorsiflexion    Ankle plantarflexion    Ankle inversion    Ankle eversion     (Blank rows = not tested)  LUMBAR SPECIAL TESTS:  Straight leg raise test: {pos/neg:25243} and Slump test: {pos/neg:25243}  FUNCTIONAL TESTS:  5 times sit to stand: ***  GAIT: Distance walked: 68ftx2 Assistive device utilized: {Assistive devices:23999} Level of assistance: {Levels of assistance:24026} Comments: ***  TODAY'S TREATMENT:                                                                                                                              DATE: 11/10/22 Eval    PATIENT EDUCATION:  Education details: Discussed eval findings, rehab rationale and POC and patient is in agreement  Person educated: Patient Education method: Explanation Education comprehension: verbalized understanding and needs further education  HOME EXERCISE PROGRAM: ***  ASSESSMENT:  CLINICAL IMPRESSION: Patient is a *** y.o. *** who was seen today for physical therapy evaluation and treatment for ***.   OBJECTIVE IMPAIRMENTS: {opptimpairments:25111}.   ACTIVITY LIMITATIONS: {activitylimitations:27494}  PARTICIPATION LIMITATIONS: {participationrestrictions:25113}  PERSONAL FACTORS: {Personal factors:25162} are also affecting patient's functional outcome.   REHAB POTENTIAL: Good  CLINICAL DECISION MAKING: Evolving/moderate  complexity  EVALUATION COMPLEXITY: Moderate   GOALS: Goals reviewed with patient? No  SHORT TERM GOALS: Target date: ***  Patient to demonstrate independence in HEP  Baseline: Goal status: INITIAL  2.  *** Baseline:  Goal status: INITIAL  3.  *** Baseline:  Goal status: INITIAL  4.  *** Baseline:  Goal status: INITIAL  5.  *** Baseline:  Goal status: INITIAL  6.  *** Baseline:  Goal status: INITIAL  LONG TERM GOALS: Target date: ***  Patient will score at least ***% on FOTO to signify clinically meaningful improvement in functional abilities.   Baseline:  Goal status: INITIAL  2.  *** Baseline:  Goal status: INITIAL  3.  *** Baseline:  Goal status: INITIAL  4.  *** Baseline:  Goal status: INITIAL  5.  *** Baseline:  Goal status: INITIAL  6.  *** Baseline:  Goal status: INITIAL  PLAN:  PT FREQUENCY: 1-2x/week  PT DURATION: 6 weeks  PLANNED INTERVENTIONS: Therapeutic exercises, Therapeutic activity, Neuromuscular re-education, Balance training, Gait training, Patient/Family education, Self Care, Joint mobilization, Aquatic Therapy, Dry Needling, Electrical stimulation, Spinal mobilization, Cryotherapy, Moist heat, Manual therapy, and Re-evaluation.  PLAN FOR NEXT SESSION: HEP review and update, manual techniques as appropriate, aerobic tasks, ROM and flexibility activities, strengthening and PREs, TPDN, gait and balance training as needed     Hildred Laser, PT 11/08/2022, 11:54 AM

## 2022-11-10 ENCOUNTER — Ambulatory Visit: Payer: Medicaid Other

## 2022-12-01 ENCOUNTER — Other Ambulatory Visit: Payer: Self-pay

## 2022-12-01 ENCOUNTER — Ambulatory Visit: Payer: Medicaid Other | Attending: Family Medicine

## 2022-12-01 DIAGNOSIS — M5459 Other low back pain: Secondary | ICD-10-CM | POA: Diagnosis present

## 2022-12-01 DIAGNOSIS — M6281 Muscle weakness (generalized): Secondary | ICD-10-CM | POA: Diagnosis present

## 2022-12-01 DIAGNOSIS — M5431 Sciatica, right side: Secondary | ICD-10-CM | POA: Diagnosis present

## 2022-12-01 NOTE — Therapy (Addendum)
OUTPATIENT PHYSICAL THERAPY THORACOLUMBAR EVALUATION/DC SUMMARY   Patient Name: Jamie Hays MRN: [id number] DOB:Nov 14, 1980, 42 y.o., female Today's Date: 01/03/2023  END OF SESSION:    Past Medical History:  Diagnosis Date   Anxiety    Headache(784.0)    Hypertension    Urinary tract infection    Past Surgical History:  Procedure Laterality Date   BREAST REDUCTION SURGERY  2000   CESAREAN SECTION     CESAREAN SECTION  07/03/2011   Procedure: CESAREAN SECTION;  Surgeon: Bing Plume, MD;  Location: [facility];  Service: Gynecology;  Laterality: N/A;  Repeat Cesarean Section Delivery Boy @ 0102, Apgars 9/9   Patient Active Problem List   Diagnosis Date Noted   Prediabetes 12/22/2014   Hypertension 06/29/2012    PCP: Renaye Rakers, MD   REFERRING PROVIDER: Tracey Harries, MD  REFERRING DIAG: Lumbar radicular pain  Rationale for Evaluation and Treatment: Rehabilitation  THERAPY DIAG:  Other low back pain - Plan: PT plan of care cert/re-cert  Muscle weakness (generalized) - Plan: PT plan of care cert/re-cert  Sciatica, right side - Plan: PT plan of care cert/re-cert  ONSET DATE: 1 month  SUBJECTIVE:                                                                                                                                                                                           SUBJECTIVE STATEMENT: Relates a month long history of mainly RLE   PERTINENT HISTORY:    PAIN:  Are you having pain? Yes: NPRS scale: 10/10 Pain location: RLE Pain description: ache, radiating Aggravating factors: prolonged sitting, driving/riding Relieving factors: medication,   PRECAUTIONS: None  RED FLAGS: None   WEIGHT BEARING RESTRICTIONS: No  FALLS:  Has patient fallen in last 6 months? No  OCCUPATION: desk work  PLOF: Independent  PATIENT GOALS: To reduce my symptoms  NEXT MD VISIT: TBD  OBJECTIVE:  Note: Objective measures were completed at Evaluation unless  otherwise noted.  DIAGNOSTIC FINDINGS:    PATIENT SURVEYS:  FOTO 60(74 predicted)  MUSCLE LENGTH: Hamstrings: Right 80 deg; Left 90 deg   POSTURE: Unremarkable  PALPATION: TTP R hamstrings biceps femoris  LUMBAR ROM:   AROM eval  Flexion 90%  Extension 75%P!  Right lateral flexion 75%  Left lateral flexion 75%  Right rotation   Left rotation    (Blank rows = not tested)  LOWER EXTREMITY ROM:   WFL throughout  Active  Right eval Left eval  Hip flexion    Hip extension    Hip abduction    Hip adduction    Hip internal rotation  Hip external rotation    Knee flexion    Knee extension    Ankle dorsiflexion    Ankle plantarflexion    Ankle inversion    Ankle eversion     (Blank rows = not tested)  LOWER EXTREMITY MMT:  [facility] for gait and transfers  MMT Right eval Left eval  Hip flexion    Hip extension    Hip abduction    Hip adduction    Hip internal rotation    Hip external rotation    Knee flexion    Knee extension    Ankle dorsiflexion    Ankle plantarflexion    Ankle inversion    Ankle eversion     (Blank rows = not tested)  LUMBAR SPECIAL TESTS:  Straight leg raise test: R hamstring discomfort and Slump test: Positive R "pulling" hamstring region  FUNCTIONAL TESTS:  5 times sit to stand: 14s  GAIT: Distance walked: 49ftx2 Assistive device utilized: None Level of assistance: Complete Independence Comments: unremarkable  TODAY'S TREATMENT:                                                                                                                              DATE: 11/10/22 Eval    PATIENT EDUCATION:  Education details: Discussed eval findings, rehab rationale and POC and patient is in agreement  Person educated: Patient Education method: Explanation Education comprehension: verbalized understanding and needs further education  HOME EXERCISE PROGRAM: Access Code: [email or web address] URL: [email or web address] Date:  12/01/2022 Prepared by: Gustavus Bryant  Exercises - Seated Table Hamstring Stretch  - 1 x daily - 5 x weekly - 1 sets - 2 reps - 30s hold  ASSESSMENT:  CLINICAL IMPRESSION: Patient is a 42 y.o. female who was seen today for physical therapy evaluation and treatment for low back pain and R posterior thigh discomfort.  Lumbar ROM is functional, nerve root stretching unremarkable, 5x STS time is functional.  Palpation finds point tenderness to R hamstring/biceps femoris.  Patient is a good candidate for OPPT to resolve hamstring trigger points and obtain painfree trunk extension.  OBJECTIVE IMPAIRMENTS: Abnormal gait, decreased activity tolerance, decreased endurance, decreased knowledge of condition, decreased mobility, difficulty walking, decreased strength, improper body mechanics, postural dysfunction, obesity, and pain.   ACTIVITY LIMITATIONS: carrying, lifting, bending, sitting, standing, squatting, and stairs  PERSONAL FACTORS: Age, Fitness, Past/current experiences, Time since onset of injury/illness/exacerbation, and 1 comorbidity: DM  are also affecting patient's functional outcome.   REHAB POTENTIAL: Good  CLINICAL DECISION MAKING: Evolving/moderate complexity  EVALUATION COMPLEXITY: Moderate   GOALS: Goals reviewed with patient? No  SHORT TERM GOALS=LONG TERM GOALS: Target date: 01/21/2023    Patient to demonstrate independence in HEP  Baseline: [id number] Goal status: INITIAL  2.  Patient will score at least 74% on FOTO to signify clinically meaningful improvement in functional abilities.   Baseline: 60% Goal status: INITIAL  3.  Decrease pain to 6/10 at worst Baseline: 10/10 worst pain  Goal status: INITIAL  4.  Demonstrate 75% trunk extension w/o pain Baseline:  AROM eval  Flexion 90%  Extension 75%P!  Right lateral flexion 75%  Left lateral flexion 75%   Goal status: INITIAL  5.  Minimal TTP R hamstrings Baseline: Moderate TTP Goal status:  INITIAL     PLAN:  PT FREQUENCY: 1-2x/week  PT DURATION: 6 weeks  PLANNED INTERVENTIONS: Therapeutic exercises, Therapeutic activity, Neuromuscular re-education, Balance training, Gait training, Patient/Family education, Self Care, Joint mobilization, Aquatic Therapy, Dry Needling, Electrical stimulation, Spinal mobilization, Cryotherapy, Moist heat, Manual therapy, and Re-evaluation.  PLAN FOR NEXT SESSION: HEP review and update, manual techniques as appropriate, aerobic tasks, ROM and flexibility activities, strengthening and PREs, TPDN, gait and balance training as needed    For all possible CPT codes, reference the Planned Interventions line above.     Check all conditions that are expected to impact treatment: {Conditions expected to impact treatment:Morbid obesity   If treatment provided at initial evaluation, no treatment charged due to lack of authorization.       Hildred Laser, PT 01/03/2023, 10:14 AM

## 2022-12-21 NOTE — Therapy (Deleted)
OUTPATIENT PHYSICAL THERAPY THORACOLUMBAR EVALUATION   Patient Name: Jamie Hays MRN: [id number] DOB:09-Jul-1980, 42 y.o., female Today's Date: 12/21/2022  END OF SESSION:    Past Medical History:  Diagnosis Date   Anxiety    Headache(784.0)    Hypertension    Urinary tract infection    Past Surgical History:  Procedure Laterality Date   BREAST REDUCTION SURGERY  2000   CESAREAN SECTION     CESAREAN SECTION  07/03/2011   Procedure: CESAREAN SECTION;  Surgeon: Bing Plume, MD;  Location: [facility];  Service: Gynecology;  Laterality: N/A;  Repeat Cesarean Section Delivery Boy @ 0102, Apgars 9/9   Patient Active Problem List   Diagnosis Date Noted   Prediabetes 12/22/2014   Hypertension 06/29/2012    PCP: Renaye Rakers, MD   REFERRING PROVIDER: Tracey Harries, MD  REFERRING DIAG: Lumbar radicular pain  Rationale for Evaluation and Treatment: Rehabilitation  THERAPY DIAG:  No diagnosis found.  ONSET DATE: 1 month  SUBJECTIVE:                                                                                                                                                                                           SUBJECTIVE STATEMENT: Relates a month long history of mainly RLE   PERTINENT HISTORY:    PAIN:  Are you having pain? Yes: NPRS scale: 10/10 Pain location: RLE Pain description: ache, radiating Aggravating factors: prolonged sitting, driving/riding Relieving factors: medication,   PRECAUTIONS: None  RED FLAGS: None   WEIGHT BEARING RESTRICTIONS: No  FALLS:  Has patient fallen in last 6 months? No  OCCUPATION: desk work  PLOF: Independent  PATIENT GOALS: To reduce my symptoms  NEXT MD VISIT: TBD  OBJECTIVE:  Note: Objective measures were completed at Evaluation unless otherwise noted.  DIAGNOSTIC FINDINGS:    PATIENT SURVEYS:  FOTO 60(74 predicted)  MUSCLE LENGTH: Hamstrings: Right 80 deg; Left 90 deg   POSTURE:  Unremarkable  PALPATION: TTP R hamstrings biceps femoris  LUMBAR ROM:   AROM eval  Flexion 90%  Extension 75%P!  Right lateral flexion 75%  Left lateral flexion 75%  Right rotation   Left rotation    (Blank rows = not tested)  LOWER EXTREMITY ROM:   WFL throughout  Active  Right eval Left eval  Hip flexion    Hip extension    Hip abduction    Hip adduction    Hip internal rotation    Hip external rotation    Knee flexion    Knee extension    Ankle dorsiflexion    Ankle plantarflexion    Ankle inversion  Ankle eversion     (Blank rows = not tested)  LOWER EXTREMITY MMT:  [facility] for gait and transfers  MMT Right eval Left eval  Hip flexion    Hip extension    Hip abduction    Hip adduction    Hip internal rotation    Hip external rotation    Knee flexion    Knee extension    Ankle dorsiflexion    Ankle plantarflexion    Ankle inversion    Ankle eversion     (Blank rows = not tested)  LUMBAR SPECIAL TESTS:  Straight leg raise test: R hamstring discomfort and Slump test: Positive R "pulling" hamstring region  FUNCTIONAL TESTS:  5 times sit to stand: 14s  GAIT: Distance walked: 38ftx2 Assistive device utilized: None Level of assistance: Complete Independence Comments: unremarkable  TODAY'S TREATMENT:                                                                                                                              DATE: 11/10/22 Eval    PATIENT EDUCATION:  Education details: Discussed eval findings, rehab rationale and POC and patient is in agreement  Person educated: Patient Education method: Explanation Education comprehension: verbalized understanding and needs further education  HOME EXERCISE PROGRAM: Access Code: [email or web address] URL: [email or web address] Date: 12/01/2022 Prepared by: Gustavus Bryant  Exercises - Seated Table Hamstring Stretch  - 1 x daily - 5 x weekly - 1 sets - 2 reps - 30s  hold  ASSESSMENT:  CLINICAL IMPRESSION: Patient is a 42 y.o. female who was seen today for physical therapy evaluation and treatment for low back pain and R posterior thigh discomfort.  Lumbar ROM is functional, nerve root stretching unremarkable, 5x STS time is functional.  Palpation finds point tenderness to R hamstring/biceps femoris.  Patient is a good candidate for OPPT to resolve hamstring trigger points and obtain painfree trunk extension.  OBJECTIVE IMPAIRMENTS: Abnormal gait, decreased activity tolerance, decreased endurance, decreased knowledge of condition, decreased mobility, difficulty walking, decreased strength, improper body mechanics, postural dysfunction, obesity, and pain.   ACTIVITY LIMITATIONS: carrying, lifting, bending, sitting, standing, squatting, and stairs  PERSONAL FACTORS: Age, Fitness, Past/current experiences, Time since onset of injury/illness/exacerbation, and 1 comorbidity: DM  are also affecting patient's functional outcome.   REHAB POTENTIAL: Good  CLINICAL DECISION MAKING: Evolving/moderate complexity  EVALUATION COMPLEXITY: Moderate   GOALS: Goals reviewed with patient? No  SHORT TERM GOALS=LONG TERM GOALS: Target date: 01/21/2023    Patient to demonstrate independence in HEP  Baseline: [id number] Goal status: INITIAL  2.  Patient will score at least 74% on FOTO to signify clinically meaningful improvement in functional abilities.   Baseline: 60% Goal status: INITIAL  3.  Decrease pain to 6/10 at worst Baseline: 10/10 worst pain Goal status: INITIAL  4.  Demonstrate 75% trunk extension w/o pain Baseline:  AROM eval  Flexion 90%  Extension 75%P!  Right lateral flexion 75%  Left lateral flexion  75%   Goal status: INITIAL  5.  Minimal TTP R hamstrings Baseline: Moderate TTP Goal status: INITIAL     PLAN:  PT FREQUENCY: 1-2x/week  PT DURATION: 6 weeks  PLANNED INTERVENTIONS: Therapeutic exercises, Therapeutic activity,  Neuromuscular re-education, Balance training, Gait training, Patient/Family education, Self Care, Joint mobilization, Aquatic Therapy, Dry Needling, Electrical stimulation, Spinal mobilization, Cryotherapy, Moist heat, Manual therapy, and Re-evaluation.  PLAN FOR NEXT SESSION: HEP review and update, manual techniques as appropriate, aerobic tasks, ROM and flexibility activities, strengthening and PREs, TPDN, gait and balance training as needed    For all possible CPT codes, reference the Planned Interventions line above.     Check all conditions that are expected to impact treatment: {Conditions expected to impact treatment:Morbid obesity   If treatment provided at initial evaluation, no treatment charged due to lack of authorization.       Hildred Laser, PT 12/21/2022, 12:38 PM

## 2022-12-22 ENCOUNTER — Telehealth: Payer: Self-pay

## 2022-12-22 ENCOUNTER — Ambulatory Visit: Payer: Medicaid Other | Attending: Family Medicine

## 2022-12-22 NOTE — Telephone Encounter (Signed)
TC due to missed visit.  Reminded patient of next appointment date and time as well attendance policy guidelines.

## 2022-12-27 NOTE — Therapy (Deleted)
OUTPATIENT PHYSICAL THERAPY THORACOLUMBAR EVALUATION   Patient Name: Jamie Hays MRN: [id number] DOB:[birth year], 42 y.o., female Today's Date: 12/27/2022  END OF SESSION:    Past Medical History:  Diagnosis Date   Anxiety    Headache(784.0)    Hypertension    Urinary tract infection    Past Surgical History:  Procedure Laterality Date   BREAST REDUCTION SURGERY  2000   CESAREAN SECTION     CESAREAN SECTION  07/03/2011   Procedure: CESAREAN SECTION;  Surgeon: Bing Plume, MD;  Location: [facility];  Service: Gynecology;  Laterality: N/A;  Repeat Cesarean Section Delivery Boy @ 0102, Apgars 9/9   Patient Active Problem List   Diagnosis Date Noted   Prediabetes 12/22/2014   Hypertension 06/29/2012    PCP: Renaye Rakers, MD   REFERRING PROVIDER: Tracey Harries, MD  REFERRING DIAG: Lumbar radicular pain  Rationale for Evaluation and Treatment: Rehabilitation  THERAPY DIAG:  No diagnosis found.  ONSET DATE: 1 month  SUBJECTIVE:                                                                                                                                                                                           SUBJECTIVE STATEMENT: Relates a month long history of mainly RLE   PERTINENT HISTORY:    PAIN:  Are you having pain? Yes: NPRS scale: 10/10 Pain location: RLE Pain description: ache, radiating Aggravating factors: prolonged sitting, driving/riding Relieving factors: medication,   PRECAUTIONS: None  RED FLAGS: None   WEIGHT BEARING RESTRICTIONS: No  FALLS:  Has patient fallen in last 6 months? No  OCCUPATION: desk work  PLOF: Independent  PATIENT GOALS: To reduce my symptoms  NEXT MD VISIT: TBD  OBJECTIVE:  Note: Objective measures were completed at Evaluation unless otherwise noted.  DIAGNOSTIC FINDINGS:    PATIENT SURVEYS:  FOTO 60(74 predicted)  MUSCLE LENGTH: Hamstrings: Right 80 deg; Left 90 deg   POSTURE:  Unremarkable  PALPATION: TTP R hamstrings biceps femoris  LUMBAR ROM:   AROM eval  Flexion 90%  Extension 75%P!  Right lateral flexion 75%  Left lateral flexion 75%  Right rotation   Left rotation    (Blank rows = not tested)  LOWER EXTREMITY ROM:   WFL throughout  Active  Right eval Left eval  Hip flexion    Hip extension    Hip abduction    Hip adduction    Hip internal rotation    Hip external rotation    Knee flexion    Knee extension    Ankle dorsiflexion    Ankle plantarflexion    Ankle inversion  Ankle eversion     (Blank rows = not tested)  LOWER EXTREMITY MMT:  [facility] for gait and transfers  MMT Right eval Left eval  Hip flexion    Hip extension    Hip abduction    Hip adduction    Hip internal rotation    Hip external rotation    Knee flexion    Knee extension    Ankle dorsiflexion    Ankle plantarflexion    Ankle inversion    Ankle eversion     (Blank rows = not tested)  LUMBAR SPECIAL TESTS:  Straight leg raise test: R hamstring discomfort and Slump test: Positive R "pulling" hamstring region  FUNCTIONAL TESTS:  5 times sit to stand: 14s  GAIT: Distance walked: 47ftx2 Assistive device utilized: None Level of assistance: Complete Independence Comments: unremarkable  TODAY'S TREATMENT:                                                                                                                              DATE: 11/10/22 Eval    PATIENT EDUCATION:  Education details: Discussed eval findings, rehab rationale and POC and patient is in agreement  Person educated: Patient Education method: Explanation Education comprehension: verbalized understanding and needs further education  HOME EXERCISE PROGRAM: Access Code: [email or web address] URL: [email or web address] Date: 12/01/2022 Prepared by: Gustavus Bryant  Exercises - Seated Table Hamstring Stretch  - 1 x daily - 5 x weekly - 1 sets - 2 reps - 30s  hold  ASSESSMENT:  CLINICAL IMPRESSION: Patient is a 42 y.o. female who was seen today for physical therapy evaluation and treatment for low back pain and R posterior thigh discomfort.  Lumbar ROM is functional, nerve root stretching unremarkable, 5x STS time is functional.  Palpation finds point tenderness to R hamstring/biceps femoris.  Patient is a good candidate for OPPT to resolve hamstring trigger points and obtain painfree trunk extension.  OBJECTIVE IMPAIRMENTS: Abnormal gait, decreased activity tolerance, decreased endurance, decreased knowledge of condition, decreased mobility, difficulty walking, decreased strength, improper body mechanics, postural dysfunction, obesity, and pain.   ACTIVITY LIMITATIONS: carrying, lifting, bending, sitting, standing, squatting, and stairs  PERSONAL FACTORS: Age, Fitness, Past/current experiences, Time since onset of injury/illness/exacerbation, and 1 comorbidity: DM  are also affecting patient's functional outcome.   REHAB POTENTIAL: Good  CLINICAL DECISION MAKING: Evolving/moderate complexity  EVALUATION COMPLEXITY: Moderate   GOALS: Goals reviewed with patient? No  SHORT TERM GOALS=LONG TERM GOALS: Target date: 01/21/2023    Patient to demonstrate independence in HEP  Baseline: [id number] Goal status: INITIAL  2.  Patient will score at least 74% on FOTO to signify clinically meaningful improvement in functional abilities.   Baseline: 60% Goal status: INITIAL  3.  Decrease pain to 6/10 at worst Baseline: 10/10 worst pain Goal status: INITIAL  4.  Demonstrate 75% trunk extension w/o pain Baseline:  AROM eval  Flexion 90%  Extension 75%P!  Right lateral flexion 75%  Left lateral flexion  75%   Goal status: INITIAL  5.  Minimal TTP R hamstrings Baseline: Moderate TTP Goal status: INITIAL     PLAN:  PT FREQUENCY: 1-2x/week  PT DURATION: 6 weeks  PLANNED INTERVENTIONS: Therapeutic exercises, Therapeutic activity,  Neuromuscular re-education, Balance training, Gait training, Patient/Family education, Self Care, Joint mobilization, Aquatic Therapy, Dry Needling, Electrical stimulation, Spinal mobilization, Cryotherapy, Moist heat, Manual therapy, and Re-evaluation.  PLAN FOR NEXT SESSION: HEP review and update, manual techniques as appropriate, aerobic tasks, ROM and flexibility activities, strengthening and PREs, TPDN, gait and balance training as needed    For all possible CPT codes, reference the Planned Interventions line above.     Check all conditions that are expected to impact treatment: {Conditions expected to impact treatment:Morbid obesity   If treatment provided at initial evaluation, no treatment charged due to lack of authorization.       Hildred Laser, PT 12/27/2022, 3:12 PM

## 2022-12-27 NOTE — Therapy (Deleted)
OUTPATIENT PHYSICAL THERAPY THORACOLUMBAR EVALUATION   Patient Name: Jamie Hays MRN: [id number] DOB:1980-09-19, 42 y.o., female Today's Date: 12/27/2022  END OF SESSION:    Past Medical History:  Diagnosis Date   Anxiety    Headache(784.0)    Hypertension    Urinary tract infection    Past Surgical History:  Procedure Laterality Date   BREAST REDUCTION SURGERY  2000   CESAREAN SECTION     CESAREAN SECTION  07/03/2011   Procedure: CESAREAN SECTION;  Surgeon: Bing Plume, MD;  Location: [facility];  Service: Gynecology;  Laterality: N/A;  Repeat Cesarean Section Delivery Boy @ 0102, Apgars 9/9   Patient Active Problem List   Diagnosis Date Noted   Prediabetes 12/22/2014   Hypertension 06/29/2012    PCP: Renaye Rakers, MD   REFERRING PROVIDER: Tracey Harries, MD  REFERRING DIAG: Lumbar radicular pain  Rationale for Evaluation and Treatment: Rehabilitation  THERAPY DIAG:  No diagnosis found.  ONSET DATE: 1 month  SUBJECTIVE:                                                                                                                                                                                           SUBJECTIVE STATEMENT: Relates a month long history of mainly RLE   PERTINENT HISTORY:    PAIN:  Are you having pain? Yes: NPRS scale: 10/10 Pain location: RLE Pain description: ache, radiating Aggravating factors: prolonged sitting, driving/riding Relieving factors: medication,   PRECAUTIONS: None  RED FLAGS: None   WEIGHT BEARING RESTRICTIONS: No  FALLS:  Has patient fallen in last 6 months? No  OCCUPATION: desk work  PLOF: Independent  PATIENT GOALS: To reduce my symptoms  NEXT MD VISIT: TBD  OBJECTIVE:  Note: Objective measures were completed at Evaluation unless otherwise noted.  DIAGNOSTIC FINDINGS:    PATIENT SURVEYS:  FOTO 60(74 predicted)  MUSCLE LENGTH: Hamstrings: Right 80 deg; Left 90 deg   POSTURE:  Unremarkable  PALPATION: TTP R hamstrings biceps femoris  LUMBAR ROM:   AROM eval  Flexion 90%  Extension 75%P!  Right lateral flexion 75%  Left lateral flexion 75%  Right rotation   Left rotation    (Blank rows = not tested)  LOWER EXTREMITY ROM:   WFL throughout  Active  Right eval Left eval  Hip flexion    Hip extension    Hip abduction    Hip adduction    Hip internal rotation    Hip external rotation    Knee flexion    Knee extension    Ankle dorsiflexion    Ankle plantarflexion    Ankle inversion  Ankle eversion     (Blank rows = not tested)  LOWER EXTREMITY MMT:  [facility] for gait and transfers  MMT Right eval Left eval  Hip flexion    Hip extension    Hip abduction    Hip adduction    Hip internal rotation    Hip external rotation    Knee flexion    Knee extension    Ankle dorsiflexion    Ankle plantarflexion    Ankle inversion    Ankle eversion     (Blank rows = not tested)  LUMBAR SPECIAL TESTS:  Straight leg raise test: R hamstring discomfort and Slump test: Positive R "pulling" hamstring region  FUNCTIONAL TESTS:  5 times sit to stand: 14s  GAIT: Distance walked: 44ftx2 Assistive device utilized: None Level of assistance: Complete Independence Comments: unremarkable  TODAY'S TREATMENT:                                                                                                                              DATE: 11/10/22 Eval    PATIENT EDUCATION:  Education details: Discussed eval findings, rehab rationale and POC and patient is in agreement  Person educated: Patient Education method: Explanation Education comprehension: verbalized understanding and needs further education  HOME EXERCISE PROGRAM: Access Code: [email or web address] URL: [email or web address] Date: 12/01/2022 Prepared by: Gustavus Bryant  Exercises - Seated Table Hamstring Stretch  - 1 x daily - 5 x weekly - 1 sets - 2 reps - 30s  hold  ASSESSMENT:  CLINICAL IMPRESSION: Patient is a 42 y.o. female who was seen today for physical therapy evaluation and treatment for low back pain and R posterior thigh discomfort.  Lumbar ROM is functional, nerve root stretching unremarkable, 5x STS time is functional.  Palpation finds point tenderness to R hamstring/biceps femoris.  Patient is a good candidate for OPPT to resolve hamstring trigger points and obtain painfree trunk extension.  OBJECTIVE IMPAIRMENTS: Abnormal gait, decreased activity tolerance, decreased endurance, decreased knowledge of condition, decreased mobility, difficulty walking, decreased strength, improper body mechanics, postural dysfunction, obesity, and pain.   ACTIVITY LIMITATIONS: carrying, lifting, bending, sitting, standing, squatting, and stairs  PERSONAL FACTORS: Age, Fitness, Past/current experiences, Time since onset of injury/illness/exacerbation, and 1 comorbidity: DM  are also affecting patient's functional outcome.   REHAB POTENTIAL: Good  CLINICAL DECISION MAKING: Evolving/moderate complexity  EVALUATION COMPLEXITY: Moderate   GOALS: Goals reviewed with patient? No  SHORT TERM GOALS=LONG TERM GOALS: Target date: 01/21/2023    Patient to demonstrate independence in HEP  Baseline: [id number] Goal status: INITIAL  2.  Patient will score at least 74% on FOTO to signify clinically meaningful improvement in functional abilities.   Baseline: 60% Goal status: INITIAL  3.  Decrease pain to 6/10 at worst Baseline: 10/10 worst pain Goal status: INITIAL  4.  Demonstrate 75% trunk extension w/o pain Baseline:  AROM eval  Flexion 90%  Extension 75%P!  Right lateral flexion 75%  Left lateral flexion  75%   Goal status: INITIAL  5.  Minimal TTP R hamstrings Baseline: Moderate TTP Goal status: INITIAL     PLAN:  PT FREQUENCY: 1-2x/week  PT DURATION: 6 weeks  PLANNED INTERVENTIONS: Therapeutic exercises, Therapeutic activity,  Neuromuscular re-education, Balance training, Gait training, Patient/Family education, Self Care, Joint mobilization, Aquatic Therapy, Dry Needling, Electrical stimulation, Spinal mobilization, Cryotherapy, Moist heat, Manual therapy, and Re-evaluation.  PLAN FOR NEXT SESSION: HEP review and update, manual techniques as appropriate, aerobic tasks, ROM and flexibility activities, strengthening and PREs, TPDN, gait and balance training as needed    For all possible CPT codes, reference the Planned Interventions line above.     Check all conditions that are expected to impact treatment: {Conditions expected to impact treatment:Morbid obesity   If treatment provided at initial evaluation, no treatment charged due to lack of authorization.       Hildred Laser, PT 12/27/2022, 10:39 AM

## 2022-12-28 ENCOUNTER — Ambulatory Visit: Payer: Medicaid Other

## 2023-01-03 ENCOUNTER — Ambulatory Visit: Payer: Medicaid Other

## 2023-01-05 ENCOUNTER — Ambulatory Visit: Payer: Medicaid Other
